# Patient Record
Sex: Female | Born: 1970 | Race: White | Hispanic: No | Marital: Married | Smoking: Former smoker
Health system: Southern US, Community
[De-identification: ages and names within clinical notes are randomized; demographics above are authoritative.]

## PROBLEM LIST (undated history)

## (undated) DIAGNOSIS — N189 Chronic kidney disease, unspecified: Secondary | ICD-10-CM

## (undated) DIAGNOSIS — M2550 Pain in unspecified joint: Secondary | ICD-10-CM

## (undated) DIAGNOSIS — T7840XA Allergy, unspecified, initial encounter: Secondary | ICD-10-CM

## (undated) DIAGNOSIS — Z8249 Family history of ischemic heart disease and other diseases of the circulatory system: Secondary | ICD-10-CM

## (undated) HISTORY — PX: WISDOM TOOTH EXTRACTION: SHX21

## (undated) HISTORY — DX: Chronic kidney disease, unspecified: N18.9

## (undated) HISTORY — DX: Pain in unspecified joint: M25.50

## (undated) HISTORY — DX: Family history of ischemic heart disease and other diseases of the circulatory system: Z82.49

## (undated) HISTORY — PX: HAND SURGERY: SHX662

## (undated) HISTORY — DX: Allergy, unspecified, initial encounter: T78.40XA

## (undated) HISTORY — PX: TUBAL LIGATION: SHX77

---

## 2006-04-23 ENCOUNTER — Ambulatory Visit: Payer: Self-pay | Admitting: Orthopedic Surgery

## 2006-04-24 ENCOUNTER — Ambulatory Visit (HOSPITAL_COMMUNITY): Admission: RE | Admit: 2006-04-24 | Discharge: 2006-04-24 | Payer: Self-pay | Admitting: Orthopedic Surgery

## 2006-04-30 ENCOUNTER — Ambulatory Visit: Payer: Self-pay | Admitting: Orthopedic Surgery

## 2006-06-12 ENCOUNTER — Encounter (INDEPENDENT_AMBULATORY_CARE_PROVIDER_SITE_OTHER): Payer: Self-pay | Admitting: *Deleted

## 2006-06-12 ENCOUNTER — Ambulatory Visit (HOSPITAL_BASED_OUTPATIENT_CLINIC_OR_DEPARTMENT_OTHER): Admission: RE | Admit: 2006-06-12 | Discharge: 2006-06-12 | Payer: Self-pay | Admitting: Orthopedic Surgery

## 2007-07-17 ENCOUNTER — Ambulatory Visit: Payer: Self-pay | Admitting: Family Medicine

## 2007-07-17 DIAGNOSIS — F17200 Nicotine dependence, unspecified, uncomplicated: Secondary | ICD-10-CM

## 2007-07-17 DIAGNOSIS — J309 Allergic rhinitis, unspecified: Secondary | ICD-10-CM | POA: Insufficient documentation

## 2007-07-17 LAB — CONVERTED CEMR LAB
Bilirubin Urine: NEGATIVE
Glucose, Urine, Semiquant: NEGATIVE
Ketones, urine, test strip: NEGATIVE
Pap Smear: NORMAL

## 2007-07-18 LAB — CONVERTED CEMR LAB
Chloride: 108 meq/L (ref 96–112)
Creatinine, Ser: 0.79 mg/dL (ref 0.40–1.20)
Sodium: 143 meq/L (ref 135–145)

## 2007-07-21 ENCOUNTER — Telehealth (INDEPENDENT_AMBULATORY_CARE_PROVIDER_SITE_OTHER): Payer: Self-pay | Admitting: *Deleted

## 2007-07-31 ENCOUNTER — Ambulatory Visit: Payer: Self-pay | Admitting: Family Medicine

## 2007-09-11 ENCOUNTER — Ambulatory Visit: Payer: Self-pay | Admitting: Family Medicine

## 2007-12-11 ENCOUNTER — Encounter (INDEPENDENT_AMBULATORY_CARE_PROVIDER_SITE_OTHER): Payer: Self-pay | Admitting: Family Medicine

## 2007-12-11 ENCOUNTER — Ambulatory Visit: Payer: Self-pay | Admitting: Family Medicine

## 2007-12-11 ENCOUNTER — Other Ambulatory Visit: Admission: RE | Admit: 2007-12-11 | Discharge: 2007-12-11 | Payer: Self-pay | Admitting: Family Medicine

## 2007-12-29 ENCOUNTER — Encounter (INDEPENDENT_AMBULATORY_CARE_PROVIDER_SITE_OTHER): Payer: Self-pay | Admitting: Family Medicine

## 2008-01-04 LAB — CONVERTED CEMR LAB
Basophils Absolute: 0 10*3/uL (ref 0.0–0.1)
Basophils Relative: 0 % (ref 0–1)
Cholesterol: 152 mg/dL (ref 0–200)
Eosinophils Absolute: 0.3 10*3/uL (ref 0.0–0.7)
HCT: 44.5 % (ref 36.0–46.0)
Hemoglobin: 14.5 g/dL (ref 12.0–15.0)
LDL Cholesterol: 96 mg/dL (ref 0–99)
Lymphs Abs: 3 10*3/uL (ref 0.7–4.0)
MCV: 89.9 fL (ref 78.0–100.0)
Monocytes Absolute: 0.8 10*3/uL (ref 0.1–1.0)
Neutrophils Relative %: 55 % (ref 43–77)
Platelets: 312 10*3/uL (ref 150–400)
RBC: 4.95 M/uL (ref 3.87–5.11)
RDW: 12.9 % (ref 11.5–15.5)
Total CHOL/HDL Ratio: 3.9

## 2008-07-29 ENCOUNTER — Ambulatory Visit: Payer: Self-pay | Admitting: Internal Medicine

## 2008-07-29 DIAGNOSIS — J019 Acute sinusitis, unspecified: Secondary | ICD-10-CM

## 2009-07-26 ENCOUNTER — Encounter: Payer: Self-pay | Admitting: Physician Assistant

## 2010-06-10 ENCOUNTER — Encounter: Payer: Self-pay | Admitting: Orthopedic Surgery

## 2010-06-19 NOTE — Letter (Signed)
Summary: M,EDICAL RELEASE  M,EDICAL RELEASE   Imported By: Lind Guest 07/26/2009 14:18:30  _____________________________________________________________________  External Attachment:    Type:   Image     Comment:   External Document

## 2010-10-05 NOTE — Op Note (Signed)
Kylie Castillo, Kylie Castillo                ACCOUNT NO.:  [id number]   MEDICAL RECORD NO.:  [id number]          PATIENT TYPE:  AMB   LOCATION:  DSC                          FACILITY:  [facility]   PHYSICIAN:  Katy Fitch. Sypher, M.D. DATE OF BIRTH:  1970/07/12   DATE OF PROCEDURE:  06/12/2006  DATE OF DISCHARGE:                               OPERATIVE REPORT   PREOP DIAGNOSES:  1. 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  2. Five masses dorsal aspect of right index finger MP joint consistent      with either mucoid skin degeneration or granuloma annulare.   POSTOP DIAGNOSES:  1. 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   OPERATION:  1. Excision of pedunculated osteochondroma and pseudo capsule right      small finger.  This is a wide marginal resection.  2. Excision of 5 granuloma annulare lesions from dorsal aspect of      right index finger metacarpal phalangeal joint.   OPERATING SURGEON:  Katy Fitch. Sypher, M.D.   ASSISTANT:  Marveen Reeks. Dasnoit, P.A.-C.   ANESTHESIA:  General by LMA.   SUPERVISING ANESTHESIOLOGIST:  Quita Skye. Krista Blue, M.D.   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 Fuller Canada of [place], [place] for evaluation of the  pedunculated osteochondroma of the right small finger middle phalanx.  She had a large firm mass presenting on the palmar surface of her middle  phalanx.  She had an MRI that confirmed a probable osteochondroma.  The  exact histopathology cannot be confirmed without biopsy.  In addition  she had five small masses on the dorsal aspect of her right index  finger, MP joint, that appeared to be adherent to the deep  surface of  the dermis.  Her skin was expanded and translucent.  These looked like  they could be mucoid degeneration of the skin lesions.  We recommended  excisional biopsy of her osteochondral lesion and excisional biopsy of  her index finger mucoid masses.  After informed consent, she is brought  to the operating at this time.  Preoperatively she was carefully advised  that we could not offer prognosis until we had a tissue diagnosis of  each lesion.   DESCRIPTION OF PROCEDURE:  Kylie Castillo is brought to the operating room  and placed in the supine position on the operating table.  Following the  induction of general anesthesia by LMA technique.  The right arm was  prepped with Betadine soap and solution and sterilely draped.  Following  exsanguination of the right arm with Esmarch bandage, the arterial  tourniquet on the proximal brachium was inflated to 120 mmHg.  Procedure  commenced with planning of a Brunner zigzag incision from the A2 pulley  to the A5 pulley.  The skin incisions were taken sharply followed by  careful elevation of skin flaps avoiding interruption of the pseudo  capsule surrounding the osteochondral lesion.  The flexor sheath was  identified proximally as were the radial and ulnar neurovascular  bundles.  The bundles were dissected distally.  The radial neurovascular  bundle was not impeded by the lesion.  The ulnar neurovascular bundle  was on the ulnar aspect of lesion.  The flexor tendons were radial to  lesion.   With careful circumferential dissection we identified the pedunculated  base of the mass.  Given my concern about possible injury to the ulnar  neurovascular bundle, I elected to remove the lesion in segments with a  large rongeur.  This was removed in four quadrants; and the pedunculated  base was carefully removed with a fine rongeur down to the normal  contour of the middle phalanx.  The pseudo capsule was meticulously  dissected.  The ulnar  neurovascular bundle was dissected, and found be  intact.  It was in its proper anatomic position posterior to Grayson's  ligaments. The pedunculated site on the middle phalanx was then  electrocauterized with bipolar forceps and saline, creating thermal  injury to the superficial bone cells.  The wound was then irrigated and  repaired with a corner suture of 5-0 nylon and interrupted sutures of 5-  0 nylon.   Attention was then directed to the index finger.  A curvilinear incision  was fashioned to allow skin flap to be elevated to expose the five  mucoid lesions.  The extensor tendon was identified; and a plane  developed between the epitenon and the dermis.  The skin was everted;  and the lesions were noted to be probable granuloma annulare. These were  meticulously removed with a medium-size rongeur down to normal-appearing  dermis.  There all passed off in formalin for pathologic evaluation.   Hemostasis was achieved followed by repair of the dorsal wound with  intradermal 4-0 Prolene.  A compressive dressing was applied with  Xeroflo, sterile gauze, and a compressive ACE wrap dressing.  There were  no apparent complications.  Kylie Castillo tolerated the surgery and  anesthesia well.  She was transferred to recovery with stable vital  signs.   She will be discharged home with a prescription for Percocet 5 mg 1-2  tablets p.o. q.4-6 h. p.r.n. pain 30 tablets without refill.  Also  Keflex 5 mg one p.o. q.8 h. x4 days as prophylactic antibiotic.      Katy Fitch Sypher, M.D.  Electronically Signed     RVS/MEDQ  D:  06/12/2006  T:  06/12/2006  Job:  [id number]   cc:   Vickki Hearing, M.D.  Tammy R. Collins [place], M.D.

## 2012-07-04 ENCOUNTER — Other Ambulatory Visit: Payer: Self-pay

## 2013-03-25 ENCOUNTER — Other Ambulatory Visit: Payer: Self-pay

## 2013-04-27 ENCOUNTER — Encounter: Payer: Self-pay | Admitting: Orthopedic Surgery

## 2013-08-30 ENCOUNTER — Other Ambulatory Visit: Payer: Self-pay | Admitting: Gastroenterology

## 2013-08-30 DIAGNOSIS — Z139 Encounter for screening, unspecified: Secondary | ICD-10-CM

## 2013-09-01 LAB — QUANTIFERON TB GOLD ASSAY (BLOOD)
INTERFERON GAMMA RELEASE ASSAY: NEGATIVE
Mitogen value: 9.56 IU/mL
QUANTIFERON NIL VALUE: 0.02 [IU]/mL
QUANTIFERON TB AG MINUS NIL: 0.01 [IU]/mL
TB AG VALUE: 0.03 [IU]/mL

## 2013-09-08 ENCOUNTER — Other Ambulatory Visit: Payer: Self-pay | Admitting: Gastroenterology

## 2013-09-08 MED ORDER — CIPROFLOXACIN HCL 500 MG PO TABS: 500.0000 mg | ORAL_TABLET | Freq: Two times a day (BID) | ORAL | Status: DC

## 2015-03-10 ENCOUNTER — Telehealth: Payer: 59 | Admitting: Family

## 2015-03-10 DIAGNOSIS — J069 Acute upper respiratory infection, unspecified: Secondary | ICD-10-CM | POA: Diagnosis not present

## 2015-03-10 MED ORDER — FLUTICASONE PROPIONATE 50 MCG/ACT NA SUSP: 1.0000 | Freq: Two times a day (BID) | NASAL | Status: DC

## 2015-03-10 NOTE — Progress Notes (Signed)
We are sorry that you are not feeling well.  Here is how we plan to help!  Based on what you have shared with me it looks like you have sinusitis.  Sinusitis is inflammation and infection in the sinus cavities of the head.  Based on your presentation I believe you most likely have Acute Viral Sinusitis. This is an infection most likely caused by a virus.  There is not specific treatment for viral sinusitis other than to help you with the symptoms until the infection runs it's course.  You may use an oral decongestant such as Mucinex D or if you have glaucoma or high blood pressure use plain Mucinex.  Saline nasal spray help and can safely be used as often as needed for congestion, I have prescribed fluticason nasal spray. Spray two sprays in each nostril twice a day to help reduce your symptoms.  Some authorities believe that zinc sprays or the use of Echinacea may shorten the course of your symptoms.  Sinus infections are not as easily transmitted as other respiratory infection, however we still recommend that you avoid close contact with loved ones, especially the very young and elderly.  Remember to wash your hands thoroughly throughout the day as this is the number one way to prevent the spread of infection!  Home Care:  Only take medications as instructed by your medical team.  Complete the entire course of an antibiotic.  Do not take these medications with alcohol.  A steam or ultrasonic humidifier can help congestion.  You can place a towel over your head and breathe in the steam from hot water coming from a faucet.  Avoid close contacts especially the very young and the elderly.  Cover your mouth when you cough or sneeze.  Always remember to wash your hands.  Get Help Right Away If:  You develop worsening fever or sinus pain.  You develop a severe head ache or visual changes.  Your symptoms persist after you have completed your treatment plan.  Make sure you  Understand these  instructions.  Will watch your condition.  Will get help right away if you are not doing well or get worse.  Your e-visit answers were reviewed by a board certified advanced clinical practitioner to complete your personal care plan.  Depending on the condition, your plan could have included both over the counter or prescription medications.  If there is a problem please reply  once you have received a response from your provider.  Your safety is important to us.  If you have drug allergies check your prescription carefully.    You can use MyChart to ask questions about today's visit, request a non-urgent call back, or ask for a work or school excuse for 24 hours related to this e-Visit. If it has been greater than 24 hours you will need to follow up with your provider, or enter a new e-Visit to address those concerns.  You will get an e-mail in the next two days asking about your experience.  I hope that your e-visit has been valuable and will speed your recovery. Thank you for using e-visits.    

## 2015-03-17 ENCOUNTER — Ambulatory Visit (INDEPENDENT_AMBULATORY_CARE_PROVIDER_SITE_OTHER): Payer: 59 | Admitting: Family Medicine

## 2015-03-17 ENCOUNTER — Telehealth: Payer: 59 | Admitting: Neurology

## 2015-03-17 VITALS — BP 130/78 | HR 82 | Temp 98.2°F | Resp 16 | Ht 65.25 in | Wt 141.0 lb

## 2015-03-17 DIAGNOSIS — J209 Acute bronchitis, unspecified: Secondary | ICD-10-CM

## 2015-03-17 DIAGNOSIS — J309 Allergic rhinitis, unspecified: Secondary | ICD-10-CM

## 2015-03-17 DIAGNOSIS — J208 Acute bronchitis due to other specified organisms: Secondary | ICD-10-CM | POA: Diagnosis not present

## 2015-03-17 DIAGNOSIS — F17200 Nicotine dependence, unspecified, uncomplicated: Secondary | ICD-10-CM | POA: Diagnosis not present

## 2015-03-17 MED ORDER — FLUTICASONE PROPIONATE 50 MCG/ACT NA SUSP: 2.0000 | Freq: Every day | NASAL | Status: DC

## 2015-03-17 MED ORDER — AZITHROMYCIN 500 MG PO TABS: 500.0000 mg | ORAL_TABLET | Freq: Every day | ORAL | Status: DC

## 2015-03-17 MED ORDER — ALBUTEROL SULFATE (2.5 MG/3ML) 0.083% IN NEBU
2.5000 mg | INHALATION_SOLUTION | Freq: Once | RESPIRATORY_TRACT | Status: AC
  Administered 2015-03-17: 2.5 mg via RESPIRATORY_TRACT

## 2015-03-17 MED ORDER — IPRATROPIUM BROMIDE 0.02 % IN SOLN
0.5000 mg | Freq: Once | RESPIRATORY_TRACT | Status: AC
  Administered 2015-03-17: 0.5 mg via RESPIRATORY_TRACT

## 2015-03-17 MED ORDER — BENZONATATE 200 MG PO CAPS: 200.0000 mg | ORAL_CAPSULE | Freq: Two times a day (BID) | ORAL | Status: DC | PRN

## 2015-03-17 MED ORDER — ALBUTEROL SULFATE 108 (90 BASE) MCG/ACT IN AEPB: 2.0000 | INHALATION_SPRAY | RESPIRATORY_TRACT | Status: DC | PRN

## 2015-03-17 MED ORDER — DEXTROMETHORPHAN POLISTIREX ER 30 MG/5ML PO SUER: 30.0000 mg | Freq: Two times a day (BID) | ORAL | Status: DC

## 2015-03-17 MED ORDER — HYDROCODONE-HOMATROPINE 5-1.5 MG/5ML PO SYRP: 5.0000 mL | ORAL_SOLUTION | Freq: Three times a day (TID) | ORAL | Status: DC | PRN

## 2015-03-17 NOTE — Progress Notes (Signed)
Subjective:  This chart was scribed for Delman Cheadle, MD by Thea Alken, ED Scribe. This patient was seen in room 10 and the patient's care was started at 1:32 PM.  Patient ID: Kylie Castillo, female    DOB: 05-Sep-1970, 44 y.o.   MRN: [id number]  HPI   Chief Complaint  Patient presents with  . Cough    x 1 week  . chest congestion    x 1 week  . Sinusitis    runny nose/ x 1 week    HPI Comments: Kylie Castillo is a 44 y.o. female who presents to the [facility] complaining of cough. She was seen on an e-visit today. They informed her that she had a URI, diagnosed with viral bronchitis. They advised her to take Ibuprofen and tylenol and prescribed her tessalon.  Symptoms started 9 days ago with nasal congestion. She stayed home the following day to rest but symptoms worsening this week with cough x 5 days ago, fever highest being 101, nasal congestion, rhinorrhea, and wheezing that started yesterday. She has tried tessalon in the past which did not work for her and did not bother picking up medication prescribed at [facility]. She has been Claritin and Robitussin. She has hx of pneumonia. She denies feeling CP,SOB, light headedness. Pt is a smoker.    Past Medical History  Diagnosis Date  . Allergy    Allergies  Allergen Reactions  . Iodine     REACTION: Swelling if on skin In system severe bradycardia - states when she had twins, had interanal exam vaginally with this and became hypotensive with severe swelling.  . Varenicline Tartrate     REACTION: Mean and angry   Prior to Admission medications   Medication Sig Start Date End Date Taking? Authorizing Provider  benzonatate (TESSALON) 200 MG capsule Take 1 capsule (200 mg total) by mouth 2 (two) times daily as needed for cough. 03/17/15  Yes Gardiner Barefoot, NP  ciprofloxacin (CIPRO) 500 MG tablet Take 1 tablet (500 mg total) by mouth 2 (two) times daily. Patient not taking: Reported on 03/17/2015 09/08/13   Orvil Feil, NP  fluticasone [facility]) 50 MCG/ACT nasal spray Place 1-2 sprays into both nostrils 2 (two) times daily. Patient not taking: Reported on 03/17/2015 03/10/15   Benjamine Mola, FNP  fluticasone (FLONASE) 50 MCG/ACT nasal spray Place 2 sprays into both nostrils daily. Patient not taking: Reported on 03/17/2015 03/17/15   Gardiner Barefoot, NP   Review of Systems  Constitutional: Positive for fever.  HENT: Positive for congestion and postnasal drip. Negative for rhinorrhea.   Respiratory: Positive for cough and wheezing. Negative for shortness of breath.   Cardiovascular: Negative for chest pain.  Neurological: Negative for light-headedness.       Objective:   Physical Exam  Constitutional: She is oriented to person, place, and time. She appears well-developed and well-nourished. No distress.  HENT:  Head: Normocephalic and atraumatic.  Right Ear: A middle ear effusion is present.  Left Ear: A middle ear effusion is present.  Mouth/Throat: Posterior oropharyngeal erythema present.  Eyes: Conjunctivae and EOM are normal.  Neck: Neck supple.  Cardiovascular: Normal rate, regular rhythm and normal heart sounds.  Exam reveals no gallop and no friction rub.   No murmur heard. Pulmonary/Chest: Effort normal. She has wheezes ( inspiratory) in the right upper field, the right middle field, the right lower field, the left upper field and the left lower field.  She has rhonchi ( expiratory) in the right upper field, the right middle field, the right lower field, the left upper field, the left middle field and the left lower field. She has no rales.  Inspiratory wheeze, expiratory rhonchi throughout all fields but worse in bases.   Musculoskeletal: Normal range of motion.  Neurological: She is alert and oriented to person, place, and time.  Skin: Skin is warm and dry.  Psychiatric: She has a normal mood and affect. Her behavior is normal.  Nursing note and vitals reviewed.  Filed  Vitals:   03/17/15 1323  BP: 130/78  Pulse: 82  Temp: 98.2 F (36.8 C)  TempSrc: Oral  Resp: 16  Height: 5' 5.25" (1.657 m)  Weight: 141 lb (63.957 kg)  SpO2: 98%       Assessment & Plan:    1. Acute bronchitis, unspecified organism   2. TOBACCO USER     Meds ordered this encounter  Medications  . albuterol (PROVENTIL) (2.5 MG/3ML) 0.083% nebulizer solution 2.5 mg    Sig:   . ipratropium (ATROVENT) nebulizer solution 0.5 mg    Sig:   . azithromycin (ZITHROMAX) 500 MG tablet    Sig: Take 1 tablet (500 mg total) by mouth daily.    Dispense:  3 tablet    Refill:  0  . Albuterol Sulfate (PROAIR RESPICLICK) 191 (90 BASE) MCG/ACT AEPB    Sig: Inhale 2 puffs into the lungs every 4 (four) hours as needed.    Dispense:  1 each    Refill:  2  . HYDROcodone-homatropine (HYCODAN) 5-1.5 MG/5ML syrup    Sig: Take 5 mLs by mouth every 8 (eight) hours as needed for cough.    Dispense:  120 mL    Refill:  0  . dextromethorphan (DELSYM) 30 MG/5ML liquid    Sig: Take 5 mLs (30 mg total) by mouth 2 (two) times daily.    Dispense:  148 mL    Refill:  0   I personally performed the services described in this documentation, which was scribed in my presence. The recorded information has been reviewed and considered, and addended by me as needed.  Delman Cheadle, MD MPH

## 2015-03-17 NOTE — Progress Notes (Deleted)
We are sorry that you are not feeling well.  Here is how we plan to help!  Based on what you have shared with me it looks like you have upper respiratory tract inflammation that has resulted in a significant cough.  Inflammation and infection in the upper respiratory tract is commonly called bronchitis and has four common causes:  Allergies, Viral Infections, Acid Reflux and Bacterial Infections.  Allergies, viruses and acid reflux are treated by controlling symptoms or eliminating the cause. An example might be a cough caused by taking certain blood pressure medications. You stop the cough by changing the medication. Another example might be a cough caused by acid reflux. Controlling the reflux helps control the cough.  Based on your presentation I believe you most likely have A cough due to a virus.  This is called viral bronchitis and is best treated by rest, plenty of fluids and control of the cough.  You may use Ibuprofen or Tylenol as directed to help your symptoms. Cough could be from allergies as well.    In addition you may use A prescription cough medication called Tessalon Perles 100mg . You may take 1-2 capsules every 8 hours as needed for your cough. A prescription nasal spray called Flonase. You can also buy Flonase OTC but your prescription will likely be less expensive. Spray 1-2 sprays in each nostril twice a day for 7 days.     HOME CARE . Only take medications as instructed by your medical team. . Complete the entire course of an antibiotic. . Drink plenty of fluids and get plenty of rest. . Avoid close contacts especially the very young and the elderly . Cover your mouth if you cough or cough into your sleeve. . Always remember to wash your hands . A steam or ultrasonic humidifier can help congestion.    GET HELP RIGHT AWAY IF: . You develop worsening fever. . You become short of breath . You cough up blood. . Your symptoms persist after you have completed your treatment  plan MAKE SURE YOU   Understand these instructions.  Will watch your condition.  Will get help right away if you are not doing well or get worse.  Your e-visit answers were reviewed by a board certified advanced clinical practitioner to complete your personal care plan.  Depending on the condition, your plan could have included both over the counter or prescription medications. If there is a problem please reply  once you have received a response from your provider. Your safety is important to [place].  If you have drug allergies check your prescription carefully.    You can use MyChart to ask questions about today's visit, request a non-urgent call back, or ask for a work or school excuse for 24 hours related to this e-Visit. If it has been greater than 24 hours you will need to follow up with your provider, or enter a new e-Visit to address those concerns. You will get an e-mail in the next two days asking about your experience.  I hope that your e-visit has been valuable and will speed your recovery. Thank you for using e-visits.

## 2015-03-17 NOTE — Progress Notes (Deleted)
We are sorry that you are not feeling well.  Here is how we plan to help!  Based on what you have shared with me it looks like you have upper respiratory tract inflammation that has resulted in a significant cough.  Inflammation and infection in the upper respiratory tract is commonly called bronchitis and has four common causes:  Allergies, Viral Infections, Acid Reflux and Bacterial Infections.  Allergies, viruses and acid reflux are treated by controlling symptoms or eliminating the cause. An example might be a cough caused by taking certain blood pressure medications. You stop the cough by changing the medication. Another example might be a cough caused by acid reflux. Controlling the reflux helps control the cough.  Based on your presentation I believe you most likely have Viral bronchitis with allergies.    In addition you may use A prescription cough medication called Tessalon Perles 100mg . You may take 1-2 capsules every 8 hours as needed for your cough.    HOME CARE . Only take medications as instructed by your medical team. . Complete the entire course of an antibiotic. . Drink plenty of fluids and get plenty of rest. . Avoid close contacts especially the very young and the elderly . Cover your mouth if you cough or cough into your sleeve. . Always remember to wash your hands . A steam or ultrasonic humidifier can help congestion.    GET HELP RIGHT AWAY IF: . You develop worsening fever. . You become short of breath . You cough up blood. . Your symptoms persist after you have completed your treatment plan MAKE SURE YOU   Understand these instructions.  Will watch your condition.  Will get help right away if you are not doing well or get worse.  Your e-visit answers were reviewed by a board certified advanced clinical practitioner to complete your personal care plan.  Depending on the condition, your plan could have included both over the counter or prescription  medications. If there is a problem please reply  once you have received a response from your provider. Your safety is important to [place].  If you have drug allergies check your prescription carefully.    You can use MyChart to ask questions about today's visit, request a non-urgent call back, or ask for a work or school excuse for 24 hours related to this e-Visit. If it has been greater than 24 hours you will need to follow up with your provider, or enter a new e-Visit to address those concerns. You will get an e-mail in the next two days asking about your experience.  I hope that your e-visit has been valuable and will speed your recovery. Thank you for using e-visits.

## 2015-03-17 NOTE — Progress Notes (Signed)
We are sorry that you are not feeling well.  Here is how we plan to help!  Based on what you have shared with me it looks like you have upper respiratory tract inflammation that has resulted in a significant cough.  Inflammation and infection in the upper respiratory tract is commonly called bronchitis and has four common causes:  Allergies, Viral Infections, Acid Reflux and Bacterial Infections.  Allergies, viruses and acid reflux are treated by controlling symptoms or eliminating the cause. An example might be a cough caused by taking certain blood pressure medications. You stop the cough by changing the medication. Another example might be a cough caused by acid reflux. Controlling the reflux helps control the cough.  Based on your presentation I believe you most likely have A cough due to a virus.  This is called viral bronchitis and is best treated by rest, plenty of fluids and control of the cough.  You may use Ibuprofen or Tylenol as directed to help your symptoms.    In addition you may use A prescription cough medication called Tessalon Perles 100mg. You may take 1-2 capsules every 8 hours as needed for your cough.    HOME CARE . Only take medications as instructed by your medical team. . Complete the entire course of an antibiotic. . Drink plenty of fluids and get plenty of rest. . Avoid close contacts especially the very young and the elderly . Cover your mouth if you cough or cough into your sleeve. . Always remember to wash your hands . A steam or ultrasonic humidifier can help congestion.    GET HELP RIGHT AWAY IF: . You develop worsening fever. . You become short of breath . You cough up blood. . Your symptoms persist after you have completed your treatment plan MAKE SURE YOU   Understand these instructions.  Will watch your condition.  Will get help right away if you are not doing well or get worse.  Your e-visit answers were reviewed by a board certified advanced  clinical practitioner to complete your personal care plan.  Depending on the condition, your plan could have included both over the counter or prescription medications. If there is a problem please reply  once you have received a response from your provider. Your safety is important to us.  If you have drug allergies check your prescription carefully.    You can use MyChart to ask questions about today's visit, request a non-urgent call back, or ask for a work or school excuse for 24 hours related to this e-Visit. If it has been greater than 24 hours you will need to follow up with your provider, or enter a new e-Visit to address those concerns. You will get an e-mail in the next two days asking about your experience.  I hope that your e-visit has been valuable and will speed your recovery. Thank you for using e-visits.   

## 2015-03-17 NOTE — Patient Instructions (Signed)

## 2015-06-19 DIAGNOSIS — H5213 Myopia, bilateral: Secondary | ICD-10-CM | POA: Diagnosis not present

## 2015-07-25 ENCOUNTER — Telehealth: Payer: 59 | Admitting: Family

## 2015-07-25 DIAGNOSIS — N390 Urinary tract infection, site not specified: Secondary | ICD-10-CM | POA: Diagnosis not present

## 2015-07-25 DIAGNOSIS — A499 Bacterial infection, unspecified: Secondary | ICD-10-CM

## 2015-07-25 MED ORDER — NITROFURANTOIN MONOHYD MACRO 100 MG PO CAPS: 100.0000 mg | ORAL_CAPSULE | Freq: Two times a day (BID) | ORAL | Status: DC

## 2015-07-25 NOTE — Progress Notes (Signed)

## 2015-09-30 ENCOUNTER — Telehealth: Payer: 59 | Admitting: Family

## 2015-09-30 DIAGNOSIS — M545 Low back pain, unspecified: Secondary | ICD-10-CM

## 2015-09-30 MED ORDER — CYCLOBENZAPRINE HCL 10 MG PO TABS: 10.0000 mg | ORAL_TABLET | Freq: Three times a day (TID) | ORAL | Status: DC | PRN

## 2015-09-30 MED ORDER — ETODOLAC 300 MG PO CAPS: 300.0000 mg | ORAL_CAPSULE | Freq: Two times a day (BID) | ORAL | Status: DC

## 2015-09-30 NOTE — Progress Notes (Signed)

## 2016-02-19 DIAGNOSIS — M2550 Pain in unspecified joint: Secondary | ICD-10-CM | POA: Diagnosis not present

## 2016-04-02 DIAGNOSIS — M2550 Pain in unspecified joint: Secondary | ICD-10-CM | POA: Diagnosis not present

## 2016-04-02 DIAGNOSIS — R5382 Chronic fatigue, unspecified: Secondary | ICD-10-CM | POA: Diagnosis not present

## 2016-04-02 DIAGNOSIS — R768 Other specified abnormal immunological findings in serum: Secondary | ICD-10-CM | POA: Diagnosis not present

## 2016-05-02 DIAGNOSIS — M2550 Pain in unspecified joint: Secondary | ICD-10-CM | POA: Diagnosis not present

## 2016-05-02 DIAGNOSIS — Z6823 Body mass index (BMI) 23.0-23.9, adult: Secondary | ICD-10-CM | POA: Diagnosis not present

## 2016-05-02 DIAGNOSIS — R5382 Chronic fatigue, unspecified: Secondary | ICD-10-CM | POA: Diagnosis not present

## 2016-05-02 DIAGNOSIS — R768 Other specified abnormal immunological findings in serum: Secondary | ICD-10-CM | POA: Diagnosis not present

## 2016-06-18 DIAGNOSIS — H5213 Myopia, bilateral: Secondary | ICD-10-CM | POA: Diagnosis not present

## 2017-07-05 DIAGNOSIS — H5213 Myopia, bilateral: Secondary | ICD-10-CM | POA: Diagnosis not present

## 2017-07-29 ENCOUNTER — Telehealth: Payer: 59 | Admitting: Family

## 2017-07-29 DIAGNOSIS — B9689 Other specified bacterial agents as the cause of diseases classified elsewhere: Secondary | ICD-10-CM

## 2017-07-29 DIAGNOSIS — J019 Acute sinusitis, unspecified: Secondary | ICD-10-CM | POA: Diagnosis not present

## 2017-07-29 MED ORDER — AMOXICILLIN-POT CLAVULANATE 875-125 MG PO TABS: 1.0000 | ORAL_TABLET | Freq: Two times a day (BID) | ORAL | 0 refills | Status: DC

## 2017-07-29 NOTE — Progress Notes (Signed)

## 2017-12-08 ENCOUNTER — Telehealth: Payer: 59 | Admitting: Family

## 2017-12-08 DIAGNOSIS — M545 Low back pain, unspecified: Secondary | ICD-10-CM

## 2017-12-08 MED ORDER — BACLOFEN 10 MG PO TABS: 10.0000 mg | ORAL_TABLET | Freq: Three times a day (TID) | ORAL | 0 refills | Status: DC | PRN

## 2017-12-08 MED ORDER — ETODOLAC 300 MG PO CAPS: 300.0000 mg | ORAL_CAPSULE | Freq: Two times a day (BID) | ORAL | 0 refills | Status: DC

## 2017-12-08 NOTE — Progress Notes (Signed)

## 2018-04-14 ENCOUNTER — Telehealth: Payer: 59 | Admitting: Family Medicine

## 2018-04-14 DIAGNOSIS — N390 Urinary tract infection, site not specified: Secondary | ICD-10-CM | POA: Diagnosis not present

## 2018-04-14 MED ORDER — CEPHALEXIN 500 MG PO CAPS: 500.0000 mg | ORAL_CAPSULE | Freq: Two times a day (BID) | ORAL | 0 refills | Status: AC

## 2018-04-14 NOTE — Progress Notes (Signed)

## 2018-08-31 ENCOUNTER — Telehealth: Payer: 59 | Admitting: Physician Assistant

## 2018-08-31 DIAGNOSIS — R319 Hematuria, unspecified: Secondary | ICD-10-CM | POA: Diagnosis not present

## 2018-08-31 DIAGNOSIS — N390 Urinary tract infection, site not specified: Secondary | ICD-10-CM | POA: Diagnosis not present

## 2018-08-31 MED ORDER — NITROFURANTOIN MONOHYD MACRO 100 MG PO CAPS: 100.0000 mg | ORAL_CAPSULE | Freq: Two times a day (BID) | ORAL | 0 refills | Status: DC

## 2018-08-31 NOTE — Progress Notes (Signed)

## 2018-12-28 ENCOUNTER — Ambulatory Visit (INDEPENDENT_AMBULATORY_CARE_PROVIDER_SITE_OTHER): Payer: 59 | Admitting: Family Medicine

## 2018-12-28 ENCOUNTER — Other Ambulatory Visit: Payer: Self-pay

## 2018-12-28 ENCOUNTER — Encounter: Payer: Self-pay | Admitting: Family Medicine

## 2018-12-28 VITALS — BP 143/82 | HR 76 | Temp 98.1°F | Ht 65.0 in | Wt 147.8 lb

## 2018-12-28 DIAGNOSIS — L089 Local infection of the skin and subcutaneous tissue, unspecified: Secondary | ICD-10-CM | POA: Diagnosis not present

## 2018-12-28 DIAGNOSIS — Z0000 Encounter for general adult medical examination without abnormal findings: Secondary | ICD-10-CM

## 2018-12-28 MED ORDER — MUPIROCIN 2 % EX OINT: 1.0000 "application " | TOPICAL_OINTMENT | Freq: Two times a day (BID) | CUTANEOUS | 0 refills | Status: DC

## 2018-12-28 NOTE — Progress Notes (Signed)
New Patient Office Visit  Subjective:  Patient ID: Kylie Castillo, female    DOB: January 14, 1971  Age: 48 y.o. MRN: [id number]  CC:  Chief Complaint  Patient presents with  . New Patient (Initial Visit)  toe infection  HPI Kylie Castillo presents for Patient is complaining of Big Toe toenail pain for 2 months. Otherwise health is good   Past Medical History:  Diagnosis Date  . Allergy     Past Surgical History:  Procedure Laterality Date  . HAND SURGERY Right   . 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 Marital status: Married    Spouse name: Not on file  . Number of children: Not on file  . Years of education: Not on file  . Highest education level: Not on file  Occupational History  . Not on file  Social Needs  . Financial resource strain: Not on file  . Food insecurity    Worry: Not on file    Inability: Not on file  . Transportation needs    Medical: Not on file    Non-medical: Not on file  Tobacco Use  . Smoking status: Quit smoking 2 years ago  Substance and Sexual Activity  . Alcohol use: Not on file  . Drug use: Not on file  . Sexual activity: Not on file  Lifestyle  . Physical activity    Days per week: Not on file    Minutes per session: Not on file  . Stress: Not on file  Relationships  . Social Herbalist on phone: Not on file    Gets together: Not on file    Attends religious service: Not on file    Active member of club or organization: Not on file    Attends meetings of clubs or organizations: Not on file    Relationship status: Not on file   . Intimate partner violence    Fear of current or ex partner: Not on file    Emotionally abused: Not on file    Physically abused: Not on file    Forced sexual activity: Not on file  Other Topics Concern  . Not on file  Social History Narrative  . Not on file    ROS Review of Systems  Cardiovascular: Positive for palpitations.  Genitourinary: Negative for difficulty urinating, dysuria and frequency.  Skin: Positive for wound.  Allergic/Immunologic: Positive for environmental allergies.  menopause-1 year post menopausal Pap smear-2015 normal Contacts/glasses [facility]-cystoscopy/u/s-renal-normal Objective:   Today's Vitals: BP (!) 143/82 (BP Location: Right Arm, Patient Position: Sitting, Cuff Size: Normal)   Pulse 76   Temp 98.1 F (36.7 C) (Oral)   Ht 5\' 5"  (1.651 m)   Wt 147 lb 12.8 oz (67 kg)   SpO2 98%   BMI 24.60 kg/m   Physical Exam Constitutional:      Appearance: Normal appearance.  HENT:     Head: Normocephalic.  Neck:     Musculoskeletal: Normal range of motion and neck supple.  Cardiovascular:     Rate and Rhythm: Normal rate and regular rhythm.     Pulses: Normal pulses.     Heart sounds: Normal heart sounds.  Neurological:     Mental Status: She is alert.   eryth- left primary toe-drainage noted-associated tenderness to palpation  Assessment & Plan:  1. Well adult exam - Lipid panel - COMPLETE METABOLIC PANEL WITH GFR - MM Digital Screening Unilat L; Future  2. Toe infection - Wound culture Topical bactroban-rx, soaks-call if not resolved Outpatient Encounter Medications as of 12/28/2018  Medication Sig  . Albuterol Sulfate (PROAIR RESPICLICK) 503 (90 BASE) MCG/ACT AEPB Inhale 2 puffs into the lungs every 4 (four) hours as needed. (Patient not taking: Reported on 12/28/2018)  . amoxicillin-clavulanate (AUGMENTIN) 875-125 MG tablet Take 1 tablet by mouth 2 (two) times daily. (Patient not taking: Reported on 12/28/2018)  . azithromycin  (ZITHROMAX) 500 MG tablet Take 1 tablet (500 mg total) by mouth daily. (Patient not taking: Reported on 12/28/2018)  . baclofen (LIORESAL) 10 MG tablet Take 1 tablet (10 mg total) by mouth every 8 (eight) hours as needed for muscle spasms. (Patient not taking: Reported on 12/28/2018)  . benzonatate (TESSALON) 200 MG capsule Take 1 capsule (200 mg total) by mouth 2 (two) times daily as needed for cough. (Patient not taking: Reported on 12/28/2018)  . cyclobenzaprine (FLEXERIL) 10 MG tablet Take 1 tablet (10 mg total) by mouth 3 (three) times daily as needed for muscle spasms. (Patient not taking: Reported on 12/28/2018)  . dextromethorphan (DELSYM) 30 MG/5ML liquid Take 5 mLs (30 mg total) by mouth 2 (two) times daily. (Patient not taking: Reported on 12/28/2018)  . etodolac (LODINE) 300 MG capsule Take 1 capsule (300 mg total) by mouth 2 (two) times daily. (Patient not taking: Reported on 12/28/2018)  . fluticasone (FLONASE) 50 MCG/ACT nasal spray Place 1-2 sprays into both nostrils 2 (two) times daily. (Patient not taking: Reported on 03/17/2015)  . HYDROcodone-homatropine (HYCODAN) 5-1.5 MG/5ML syrup Take 5 mLs by mouth every 8 (eight) hours as needed for cough. (Patient not taking: Reported on 12/28/2018)  . nitrofurantoin, macrocrystal-monohydrate, (MACROBID) 100 MG capsule Take 1 capsule (100 mg total) by mouth 2 (two) times daily. (Patient not taking: Reported on 12/28/2018)  . nitrofurantoin, macrocrystal-monohydrate, (MACROBID) 100 MG capsule Take 1 capsule (100 mg total) by mouth 2 (two) times daily. (Patient not taking: Reported on 12/28/2018)   No facility-administered encounter medications on file as of 12/28/2018.    LISA Hannah Beat, MD

## 2018-12-28 NOTE — Patient Instructions (Addendum)
Start soaks twice a day with antibiotic applied to area

## 2019-01-02 LAB — WOUND CULTURE
MICRO NUMBER:: 758798
SPECIMEN QUALITY:: ADEQUATE

## 2019-01-16 ENCOUNTER — Other Ambulatory Visit: Payer: Self-pay | Admitting: Family Medicine

## 2019-01-16 MED ORDER — SULFAMETHOXAZOLE-TRIMETHOPRIM 400-80 MG PO TABS: 1.0000 | ORAL_TABLET | Freq: Two times a day (BID) | ORAL | 0 refills | Status: DC

## 2019-01-16 NOTE — Progress Notes (Signed)
+   wound culture-sensitive to Bactrim D/w medication recommendation due to + culture. Pt states wound doing better but not completely resolved.

## 2019-01-19 ENCOUNTER — Ambulatory Visit (INDEPENDENT_AMBULATORY_CARE_PROVIDER_SITE_OTHER): Payer: 59 | Admitting: Family Medicine

## 2019-01-19 ENCOUNTER — Other Ambulatory Visit (HOSPITAL_COMMUNITY)
Admission: RE | Admit: 2019-01-19 | Discharge: 2019-01-19 | Disposition: A | Discharge status: Home / Self Care | Payer: 59 | Source: Ambulatory Visit | Attending: Family Medicine | Admitting: Family Medicine

## 2019-01-19 ENCOUNTER — Telehealth: Payer: Self-pay

## 2019-01-19 ENCOUNTER — Other Ambulatory Visit: Payer: Self-pay

## 2019-01-19 VITALS — BP 116/77 | HR 78 | Temp 98.3°F | Ht 65.0 in | Wt 146.0 lb

## 2019-01-19 DIAGNOSIS — Z0000 Encounter for general adult medical examination without abnormal findings: Secondary | ICD-10-CM

## 2019-01-19 DIAGNOSIS — Z01419 Encounter for gynecological examination (general) (routine) without abnormal findings: Secondary | ICD-10-CM | POA: Insufficient documentation

## 2019-01-19 NOTE — Progress Notes (Signed)
9/1/20208:36 AM  Kylie Castillo Jun 25, 1970, 48 y.o., female [id number]  Chief Complaint  Patient presents with  . Annual Exam    HPI: WELL WOMAN EXAM-pap smear  LMP: 1 year ago CHANGES IN PERIODS: none in 1 year NUMBER OF PREGNANCIES: G3P4-twin vaginal delivery-1 breech CONTRACEPTION/HRT none-tubal LAST MAMMOGRAM/Ultrasound: scheduled GYN SURGERIES: tubal ligation  2015 normal pap No h/o abnormal +FH ovarian CA Fall Risk  12/28/2018  Falls in the past year? 0  Number falls in past yr: 0  Injury with Fall? 0   Depression screen [facility] 2/9 12/28/2018 03/17/2015  Decreased Interest 0 0  Down, Depressed, Hopeless 0 0  PHQ - 2 Score 0 0    Allergies  Allergen Reactions  . Iodine     REACTION: Swelling if on skin In system severe bradycardia - states when she had twins, had interanal exam vaginally with this and became hypotensive with severe swelling.  . Varenicline Tartrate     REACTION: Mean and angry    Past Medical History:  Diagnosis Date  . Allergy     Past Surgical History:  Procedure Laterality Date  . HAND SURGERY Right   . TUBAL LIGATION      Social History   Tobacco Use  . Smoking status: Former Smoker    Packs/day: 1.00    Types: Cigarettes    Quit date: 12/27/2016    Years since quitting: 2.0  Substance Use Topics  . Alcohol use: Not on file    Social History   Social History Narrative  . Not on file     FH-dad-DM, MI-70yo, mother-CVA-82yo-residual weakness , left side, hysterectomy 43 due to fibroids sibs-brothers-died contruction accident 25yo, polyps on colonoscopy-48yo, CVA-48yo, tob abuse Grandmother with colon cancer, onset late 35's died-60yo. Maternal aunt +ovarian CA, Maternal great aunt x 2 ovarian CA  ROS: Constitutional: no loss of appetite,  unexplained weight loss/gain , night sweats, scalp tenderness, prior diagnosis of cancer HEENT: no difficulty with hearing, sinus problems, runny nose, post nasal drip, ringing in the  ears, mouth sores, loose teeth, ear pain, nosebleeds, sore throat, facial pain or numbness, no visual changes, no itching or drainage of the eyes Breast: no nipple discharge or breast lumps palpable CV: palpitations noted 1x/week RESP: no short of breath, night sweats, prolonged cough, wheezing, sputum production,   GI: no heartburn, no changes in bowels GU: no painful urination, frequent of urination, urgency, bladder problems MS: no joint pain, swelling in the joints Skin:toe improved with antibiotics Neuro: no headaches PSY: no insomnia, depression, anxiety Endo: no intolerance to heat or cold, no increase in thirst Heme: no easy brusing, anemia, swollen lymph nodes Allergy:  seasonal allergies  Today's Vitals   01/19/19 0826  BP: 116/77  Pulse: 78  Temp: 98.3 F (36.8 C)  TempSrc: Oral  SpO2: 98%  Weight: 146 lb (66.2 kg)  Height: 5\' 5"  (1.651 m)   Body mass index is 24.3 kg/m.  EXAM:  Constitutional:  HEENT: normocephalic, atraumatic, scalp with no lesions, no hair loss Neck: normal flexion/extension/rotation Thyroid: no enlargement Breast: symmetrical, no nipple discharge, no masses, no skin changes Lungs: clear breath sounds to auscultation Heart: no lifts or heaves , normal S1/2 with no murmur on auscultation Abdomen: no scars or striae, normal bowel sounds, no tenderness to palpation, liver and spleen non palpable Back: no CVA tenderness, no paraspinal tenderness Neuro: awake and alert, oriented to person, place and time, normal gait and balance GYN: normal labia, clitoris, urethral orifice  and introitus,  Cervix-pink with no lesions or discharge noted, pap collected Vagina-no lesions, normal mucosa,  no bulge with straining Bimanual exam: uterus is anterior, tilted left, not enlarged, adnexa non palpable Anus no fissure or hemorrhoids  ASSESSMENT/PLAN:  1. 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 Last  pap 2015 normal - Cytology - PAP Encouraged baseline labwork    Benny Lennert, MD

## 2019-01-19 NOTE — Telephone Encounter (Signed)
LeighAnn Wylodean Shimmel, CMA  

## 2019-01-21 LAB — CYTOLOGY - PAP
Adequacy: ABSENT
Diagnosis: NEGATIVE
HPV: NOT DETECTED

## 2019-02-11 DIAGNOSIS — H5213 Myopia, bilateral: Secondary | ICD-10-CM | POA: Diagnosis not present

## 2019-04-08 ENCOUNTER — Telehealth: Payer: 59 | Admitting: Physician Assistant

## 2019-04-08 DIAGNOSIS — N390 Urinary tract infection, site not specified: Secondary | ICD-10-CM

## 2019-04-08 MED ORDER — CEPHALEXIN 500 MG PO CAPS: 500.0000 mg | ORAL_CAPSULE | Freq: Two times a day (BID) | ORAL | 0 refills | Status: DC

## 2019-04-08 NOTE — Progress Notes (Signed)
We are sorry that you are not feeling well.  Here is how we plan to help!  Based on what you shared with me it looks like you most likely have a simple urinary tract infection.  A UTI (Urinary Tract Infection) is a bacterial infection of the bladder.  Most cases of urinary tract infections are simple to treat but a key part of your care is to encourage you to drink plenty of fluids and watch your symptoms carefully.  I have prescribed Keflex 500 mg twice daily for 5 days.  Your symptoms should gradually improve. Call us if the burning in your urine worsens, you develop worsening fever, back pain or pelvic pain or if your symptoms do not resolve after completing the antibiotic.  Urinary tract infections can be prevented by drinking plenty of water to keep your body hydrated.  Also be sure when you wipe, wipe from front to back and don't hold it in!  If possible, empty your bladder every 4 hours.  Your e-visit answers were reviewed by a board certified advanced clinical practitioner to complete your personal care plan.  Depending on the condition, your plan could have included both over the counter or prescription medications.  If there is a problem please reply  once you have received a response from your provider.  Your safety is important to [place].  If you have drug allergies check your prescription carefully.    You can use MyChart to ask questions about today's visit, request a non-urgent call back, or ask for a work or school excuse for 24 hours related to this e-Visit. If it has been greater than 24 hours you will need to follow up with your provider, or enter a new e-Visit to address those concerns.   You will get an e-mail in the next two days asking about your experience.  I hope that your e-visit has been valuable and will speed your recovery. Thank you for using e-visits.   Greater than 5 minutes, yet less than 10 minutes of time have been spent researching, coordinating, and  implementing care for this patient today

## 2019-04-28 ENCOUNTER — Other Ambulatory Visit: Payer: Self-pay | Admitting: Family Medicine

## 2019-04-28 DIAGNOSIS — Z0000 Encounter for general adult medical examination without abnormal findings: Secondary | ICD-10-CM | POA: Diagnosis not present

## 2019-04-29 LAB — COMPLETE METABOLIC PANEL WITH GFR
AG Ratio: 1.9 (calc) (ref 1.0–2.5)
ALT: 10 U/L (ref 6–29)
AST: 15 U/L (ref 10–35)
Albumin: 4.3 g/dL (ref 3.6–5.1)
Alkaline phosphatase (APISO): 59 U/L (ref 31–125)
BUN: 8 mg/dL (ref 7–25)
CO2: 28 mmol/L (ref 20–32)
Calcium: 9.7 mg/dL (ref 8.6–10.2)
Chloride: 103 mmol/L (ref 98–110)
Creat: 0.99 mg/dL (ref 0.50–1.10)
GFR, Est African American: 79 mL/min/{1.73_m2} (ref >=60)
GFR, Est Non African American: 68 mL/min/{1.73_m2} (ref >=60)
Globulin: 2.3 g/dL (calc) (ref 1.9–3.7)
Glucose, Bld: 83 mg/dL (ref 65–99)
Potassium: 4.3 mmol/L (ref 3.5–5.3)
Sodium: 139 mmol/L (ref 135–146)
Total Bilirubin: 0.6 mg/dL (ref 0.2–1.2)
Total Protein: 6.6 g/dL (ref 6.1–8.1)

## 2019-04-29 LAB — LIPID PANEL
Cholesterol: 186 mg/dL (ref <200)
HDL: 46 mg/dL — ABNORMAL LOW (ref >=50)
LDL Cholesterol (Calc): 119 mg/dL (calc) — ABNORMAL HIGH
Non-HDL Cholesterol (Calc): 140 mg/dL (calc) — ABNORMAL HIGH (ref <130)
Total CHOL/HDL Ratio: 4 (calc) (ref <5.0)
Triglycerides: 105 mg/dL (ref <150)

## 2019-05-11 ENCOUNTER — Other Ambulatory Visit (HOSPITAL_COMMUNITY): Payer: Self-pay | Admitting: Family Medicine

## 2019-05-11 DIAGNOSIS — Z1231 Encounter for screening mammogram for malignant neoplasm of breast: Secondary | ICD-10-CM

## 2019-05-12 ENCOUNTER — Other Ambulatory Visit: Payer: Self-pay

## 2019-05-12 ENCOUNTER — Ambulatory Visit (HOSPITAL_COMMUNITY)
Admission: RE | Admit: 2019-05-12 | Discharge: 2019-05-12 | Disposition: A | Discharge status: Home / Self Care | Payer: 59 | Source: Ambulatory Visit | Attending: Family Medicine | Admitting: Family Medicine

## 2019-05-12 DIAGNOSIS — Z1231 Encounter for screening mammogram for malignant neoplasm of breast: Secondary | ICD-10-CM | POA: Insufficient documentation

## 2019-08-02 ENCOUNTER — Ambulatory Visit: Payer: 59 | Admitting: Family Medicine

## 2019-08-02 ENCOUNTER — Ambulatory Visit (HOSPITAL_COMMUNITY)
Admission: RE | Admit: 2019-08-02 | Discharge: 2019-08-02 | Disposition: A | Discharge status: Home / Self Care | Payer: 59 | Source: Ambulatory Visit | Attending: Family Medicine | Admitting: Family Medicine

## 2019-08-02 ENCOUNTER — Encounter: Payer: Self-pay | Admitting: Family Medicine

## 2019-08-02 ENCOUNTER — Other Ambulatory Visit: Payer: Self-pay

## 2019-08-02 VITALS — BP 131/80 | HR 80 | Temp 98.0°F | Ht 65.0 in | Wt 155.4 lb

## 2019-08-02 DIAGNOSIS — S59912A Unspecified injury of left forearm, initial encounter: Secondary | ICD-10-CM | POA: Diagnosis not present

## 2019-08-02 DIAGNOSIS — M79602 Pain in left arm: Secondary | ICD-10-CM | POA: Diagnosis not present

## 2019-08-02 NOTE — Patient Instructions (Signed)
Xray left forearm

## 2019-08-02 NOTE — Progress Notes (Signed)
Established Patient Office Visit  Subjective:  Patient ID: Kylie Castillo, female    DOB: [birth year]  Age: 49 y.o. MRN: [id number]  CC:  Chief Complaint  Patient presents with  . Arm Pain    left arm pain post fall. Still having arm pain. Can move arm but can not twist lower arm. DOI 08/01/19    HPI Kylie Castillo presents left forearm pain after falling while  Cleaning her  yard-stepped on concrete block-pt fell on top of the block on her forearm with full body weight. Now pt has  pain  with rotation of her arm. No numbness. No pain in the wrist or hand. Swelling and bruising where she fell on her arm. FROM of shoulder , wrist and hand.   Past Medical History:  Diagnosis Date  . Allergy     Past Surgical History:  Procedure Laterality Date  . HAND SURGERY Right   . TUBAL LIGATION      Family History  Problem Relation Age of Onset  . Stroke Mother   . Heart disease Father   . Diabetes Father     Social History   Socioeconomic History  . Marital status: Married    Spouse name: Not on file  . Number of children: Not on file  . Years of education: Not on file  . Highest education level: Not on file  Occupational History  . Not on file  Tobacco Use  . Smoking status: Former Smoker    Packs/day: 1.00    Types: Cigarettes    Quit date: 12/27/2016    Years since quitting: 2.5  . Smokeless tobacco: Never Used  Substance and Sexual Activity  . Alcohol use: Not on file    Alcohol/week: 0.0 standard drinks  . Drug use: Never  . Sexual activity: Not on file  Other Topics Concern  . Not on file  Social History Narrative  . Not on file   Social Determinants of Health   Financial Resource Strain:   . Difficulty of Paying Living Expenses:   Food Insecurity:   . Worried About Charity fundraiser in the Last Year:   . Arboriculturist in the Last Year:   Transportation Needs:   . Film/video editor (Medical):   Marland Kitchen Lack of Transportation (Non-Medical):   Physical  Activity:   . Days of Exercise per Week:   . Minutes of Exercise per Session:   Stress:   . Feeling of Stress :   Social Connections:   . Frequency of Communication with Friends and Family:   . Frequency of Social Gatherings with Friends and Family:   . Attends Religious Services:   . Active Member of Clubs or Organizations:   . Attends Archivist Meetings:   Marland Kitchen Marital Status:   Intimate Partner Violence:   . Fear of Current or Ex-Partner:   . Emotionally Abused:   Marland Kitchen Physically Abused:   . Sexually Abused:     Outpatient Medications Prior to Visit  Medication Sig Dispense Refill  . Albuterol Sulfate (PROAIR RESPICLICK) 123XX123 (90 BASE) MCG/ACT AEPB Inhale 2 puffs into the lungs every 4 (four) hours as needed. (Patient not taking: Reported on 12/28/2018) 1 each 2  . amoxicillin-clavulanate (AUGMENTIN) 875-125 MG tablet Take 1 tablet by mouth 2 (two) times daily. (Patient not taking: Reported on 12/28/2018) 14 tablet 0  . azithromycin (ZITHROMAX) 500 MG tablet Take 1 tablet (500 mg total) by mouth daily. (Patient not  taking: Reported on 12/28/2018) 3 tablet 0  . baclofen (LIORESAL) 10 MG tablet Take 1 tablet (10 mg total) by mouth every 8 (eight) hours as needed for muscle spasms. (Patient not taking: Reported on 12/28/2018) 30 each 0  . benzonatate (TESSALON) 200 MG capsule Take 1 capsule (200 mg total) by mouth 2 (two) times daily as needed for cough. (Patient not taking: Reported on 12/28/2018) 20 capsule 0  . cephALEXin (KEFLEX) 500 MG capsule Take 1 capsule (500 mg total) by mouth 2 (two) times daily. 10 capsule 0  . cyclobenzaprine (FLEXERIL) 10 MG tablet Take 1 tablet (10 mg total) by mouth 3 (three) times daily as needed for muscle spasms. (Patient not taking: Reported on 12/28/2018) 30 tablet 0  . dextromethorphan (DELSYM) 30 MG/5ML liquid Take 5 mLs (30 mg total) by mouth 2 (two) times daily. (Patient not taking: Reported on 12/28/2018) 148 mL 0  . etodolac (LODINE) 300 MG  capsule Take 1 capsule (300 mg total) by mouth 2 (two) times daily. (Patient not taking: Reported on 12/28/2018) 20 capsule 0  . fluticasone (FLONASE) 50 MCG/ACT nasal spray Place 1-2 sprays into both nostrils 2 (two) times daily. (Patient not taking: Reported on 03/17/2015) 16 g 6  . HYDROcodone-homatropine (HYCODAN) 5-1.5 MG/5ML syrup Take 5 mLs by mouth every 8 (eight) hours as needed for cough. (Patient not taking: Reported on 12/28/2018) 120 mL 0  . mupirocin ointment (BACTROBAN) 2 % Place 1 application into the nose 2 (two) times daily. 22 g 0  . nitrofurantoin, macrocrystal-monohydrate, (MACROBID) 100 MG capsule Take 1 capsule (100 mg total) by mouth 2 (two) times daily. (Patient not taking: Reported on 12/28/2018) 10 capsule 0  . nitrofurantoin, macrocrystal-monohydrate, (MACROBID) 100 MG capsule Take 1 capsule (100 mg total) by mouth 2 (two) times daily. (Patient not taking: Reported on 12/28/2018) 20 capsule 0  . sulfamethoxazole-trimethoprim (BACTRIM) 400-80 MG tablet Take 1 tablet by mouth 2 (two) times daily. 14 tablet 0   No facility-administered medications prior to visit.    Allergies  Allergen Reactions  . Iodine     REACTION: Swelling if on skin In system severe bradycardia - states when she had twins, had interanal exam vaginally with this and became hypotensive with severe swelling.  . Varenicline Tartrate     REACTION: Mean and angry    ROS Review of Systems  Musculoskeletal:       Forearm-swelling, tenderness       Objective:    Physical Exam  Musculoskeletal:     Left forearm: Swelling and tenderness present.     Comments: Forearm-ecchymosis, eryth, point tenderness to palpation    BP 131/80 (BP Location: Right Arm, Patient Position: Sitting, Cuff Size: Normal)   Pulse 80   Temp 98 F (36.7 C) (Temporal)   Ht 5\' 5"  (1.651 m)   Wt 155 lb 6.4 oz (70.5 kg)   LMP 02/21/2015   SpO2 98%   BMI 25.86 kg/m  Wt Readings from Last 3 Encounters:  08/02/19 155 lb  6.4 oz (70.5 kg)  01/19/19 146 lb (66.2 kg)  12/28/18 147 lb 12.8 oz (67 kg)     Health Maintenance Due  Topic Date Due  . TETANUS/TDAP  Never done    Lab Results  Component Value Date   WBC 9.1 12/29/2007   HGB 14.5 12/29/2007   HCT 44.5 12/29/2007   MCV 89.9 12/29/2007   PLT 312 12/29/2007   Lab Results  Component Value Date   NA 139 04/28/2019  K 4.3 04/28/2019   CO2 28 04/28/2019   GLUCOSE 83 04/28/2019   BUN 8 04/28/2019   CREATININE 0.99 04/28/2019   BILITOT 0.6 04/28/2019   AST 15 04/28/2019   ALT 10 04/28/2019   PROT 6.6 04/28/2019   CALCIUM 9.7 04/28/2019   Lab Results  Component Value Date   CHOL 186 04/28/2019   Lab Results  Component Value Date   HDL 46 (L) 04/28/2019   Lab Results  Component Value Date   LDLCALC 119 (H) 04/28/2019   Lab Results  Component Value Date   TRIG 105 04/28/2019   Lab Results  Component Value Date   CHOLHDL 4.0 04/28/2019     Assessment & Plan:   1. Pain of left upper extremity - DG Forearm Left; Future Ice, Aleve xray Follow-up: prn  Kaz Auld Hannah Beat, MD

## 2019-09-17 ENCOUNTER — Encounter: Payer: Self-pay | Admitting: Family Medicine

## 2020-01-18 ENCOUNTER — Other Ambulatory Visit: Payer: Self-pay

## 2020-01-18 ENCOUNTER — Encounter: Payer: Self-pay | Admitting: Family Medicine

## 2020-01-18 ENCOUNTER — Ambulatory Visit: Payer: 59 | Admitting: Family Medicine

## 2020-01-18 VITALS — BP 127/79 | HR 80 | Temp 98.1°F | Ht 64.0 in | Wt 148.2 lb

## 2020-01-18 DIAGNOSIS — R232 Flushing: Secondary | ICD-10-CM

## 2020-01-18 DIAGNOSIS — D229 Melanocytic nevi, unspecified: Secondary | ICD-10-CM

## 2020-01-18 DIAGNOSIS — M2550 Pain in unspecified joint: Secondary | ICD-10-CM | POA: Diagnosis not present

## 2020-01-18 MED ORDER — DULOXETINE HCL 30 MG PO CPEP: 30.0000 mg | ORAL_CAPSULE | Freq: Every day | ORAL | 2 refills | Status: DC

## 2020-01-18 MED FILL — DULoxetine HCL 30 MG CPEP: 30 | 30 days supply | Qty: 30 | Fill #0

## 2020-01-18 NOTE — Progress Notes (Signed)
New Patient Office Visit  Assessment & Plan:  1. Hot flashes - Uncontrolled. Started patient on Cymbalta today.  - DULoxetine (CYMBALTA) 30 MG capsule; Take 1 capsule (30 mg total) by mouth daily.  Dispense: 30 capsule; Refill: 2  2. Polyarthralgia - Uncontrolled. Started patient on Cymbalta today.  - DULoxetine (CYMBALTA) 30 MG capsule; Take 1 capsule (30 mg total) by mouth daily.  Dispense: 30 capsule; Refill: 2  3. Atypical mole - Ambulatory referral to Dermatology   Follow-up: Return in about 3 months (around 04/18/2020) for joint pain & hot flashes.   Hendricks Limes, MSN, APRN, FNP-C [facility]  Subjective:  Patient ID: Kylie Castillo, female    DOB: [birth year]  Age: 49 y.o. MRN: [id number]  Patient Care Team: Loman Brooklyn, FNP as PCP - General (Family Medicine)  CC:  Chief Complaint  Patient presents with  . New Patient (Initial Visit)    Dr. Holly Bodily   . Establish Care  . Hot Flashes    Patient states it has been going on 8-9 months.  . Joint Pain    Has been going on since 2015.  Marland Kitchen Nevus    Patient states there a few moles on her back that she would liked checked.    HPI Kylie Castillo presents to establish care.  Patient is transferring from Dr. Holly Bodily as she is no longer working in [place].  Patient reports hot flashes that have been going on for the past 8 or 9 months.  They are worse at night.  She went through menopause 2 years ago.  She has tried multiple over-the-counter treatment regimens including black cohosh and Estroven.  There are some others that she has tried but she cannot recall their names.  Patient reports joint pain since 2015.  2016 and 2017 were really bad.  Most of her pain occurs in her knees, elbows, and wrists.  At one point she had an elevated ANA and was told she had lupus but when she went to see a rheumatologist and had labs repeated everything was normal.  Patient takes ibuprofen as needed for  pain.  Patient would like moles on her back checked since she is unable to see them.  She does report itching to them.   Review of Systems  Constitutional: Negative for chills, fever, malaise/fatigue and weight loss.  HENT: Negative for congestion, ear discharge, ear pain, nosebleeds, sinus pain, sore throat and tinnitus.   Eyes: Negative for blurred vision, double vision, pain, discharge and redness.  Respiratory: Negative for cough, shortness of breath and wheezing.   Cardiovascular: Negative for chest pain, palpitations and leg swelling.  Gastrointestinal: Negative for abdominal pain, constipation, diarrhea, heartburn, nausea and vomiting.  Genitourinary: Negative for dysuria, frequency and urgency.  Musculoskeletal: Positive for joint pain. Negative for myalgias.  Skin: Negative for rash.  Neurological: Negative for dizziness, seizures, weakness and headaches.  Psychiatric/Behavioral: Negative for depression, substance abuse and suicidal ideas. The patient is not nervous/anxious.     Current Outpatient Medications:  .  BEE POLLEN PO, Take by mouth., Disp: , Rfl:  .  Cyanocobalamin (B-12) 2500 MCG TABS, Take 1 tablet by mouth daily., Disp: , Rfl:   Allergies  Allergen Reactions  . Iodine     REACTION: Swelling if on skin In system severe bradycardia - states when she had twins, had interanal exam vaginally with this and became hypotensive with severe swelling.  . Varenicline Tartrate     REACTION: Mean  and angry    Past Medical History:  Diagnosis Date  . Allergy     Past Surgical History:  Procedure Laterality Date  . HAND SURGERY Right   . TUBAL LIGATION    . WISDOM TOOTH EXTRACTION      Family History  Problem Relation Age of Onset  . Stroke Mother   . Hyperlipidemia Mother   . Heart disease Father   . Diabetes Father   . Hypertension Father   . Hyperlipidemia Father   . Alcohol abuse Brother   . Stroke Brother   . Ovarian cancer Maternal Aunt   . Anxiety  disorder Maternal Aunt   . Depression Maternal Aunt   . Heart disease Maternal Aunt   . Colon cancer Maternal Grandmother   . Lung cancer Maternal Grandfather   . Heart disease Maternal Grandfather   . Hyperlipidemia Maternal Grandfather   . Heart attack Paternal Grandfather   . Heart disease Paternal Grandfather   . Hyperlipidemia Paternal Grandfather     Social History   Socioeconomic History  . Marital status: Married    Spouse name: Not on file  . Number of children: Not on file  . Years of education: Not on file  . Highest education level: Not on file  Occupational History  . Not on file  Tobacco Use  . Smoking status: Former Smoker    Packs/day: 1.00    Types: Cigarettes    Quit date: 08/01/2016    Years since quitting: 3.4  . Smokeless tobacco: Never Used  Vaping Use  . Vaping Use: Never used  Substance and Sexual Activity  . Alcohol use: Not Currently    Alcohol/week: 0.0 standard drinks  . Drug use: Never  . Sexual activity: Yes    Birth control/protection: Surgical  Other Topics Concern  . Not on file  Social History Narrative  . Not on file   Social Determinants of Health   Financial Resource Strain:   . Difficulty of Paying Living Expenses: Not on file  Food Insecurity:   . Worried About Charity fundraiser in the Last Year: Not on file  . Ran Out of Food in the Last Year: Not on file  Transportation Needs:   . Lack of Transportation (Medical): Not on file  . Lack of Transportation (Non-Medical): Not on file  Physical Activity:   . Days of Exercise per Week: Not on file  . Minutes of Exercise per Session: Not on file  Stress:   . Feeling of Stress : Not on file  Social Connections:   . Frequency of Communication with Friends and Family: Not on file  . Frequency of Social Gatherings with Friends and Family: Not on file  . Attends Religious Services: Not on file  . Active Member of Clubs or Organizations: Not on file  . Attends Theatre manager Meetings: Not on file  . Marital Status: Not on file  Intimate Partner Violence:   . Fear of Current or Ex-Partner: Not on file  . Emotionally Abused: Not on file  . Physically Abused: Not on file  . Sexually Abused: Not on file    Objective:   Today's Vitals: BP 127/79   Pulse 80   Temp 98.1 F (36.7 C) (Temporal)   Ht 5\' 4"  (1.626 m)   Wt 148 lb 3.2 oz (67.2 kg)   LMP 02/21/2015   SpO2 99%   BMI 25.44 kg/m   Physical Exam Vitals reviewed.  Constitutional:  General: She is not in acute distress.    Appearance: Normal appearance. She is normal weight. She is not ill-appearing, toxic-appearing or diaphoretic.  HENT:     Head: Normocephalic and atraumatic.  Eyes:     General: No scleral icterus.       Right eye: No discharge.        Left eye: No discharge.     Conjunctiva/sclera: Conjunctivae normal.  Cardiovascular:     Rate and Rhythm: Normal rate and regular rhythm.     Heart sounds: Normal heart sounds. No murmur heard.  No friction rub. No gallop.   Pulmonary:     Effort: Pulmonary effort is normal. No respiratory distress.     Breath sounds: Normal breath sounds. No stridor. No wheezing, rhonchi or rales.  Musculoskeletal:        General: Normal range of motion.     Cervical back: Normal range of motion.  Skin:    General: Skin is warm and dry.     Capillary Refill: Capillary refill takes less than 2 seconds.     Comments: Patient has two moles on the upper right side of her back. The top one is round, smooth, and symmetrical; the one below is irregular and bumpy.   Neurological:     General: No focal deficit present.     Mental Status: She is alert and oriented to person, place, and time. Mental status is at baseline.  Psychiatric:        Mood and Affect: Mood normal.        Behavior: Behavior normal.        Thought Content: Thought content normal.        Judgment: Judgment normal.

## 2020-01-19 ENCOUNTER — Encounter: Payer: 59 | Admitting: Family Medicine

## 2020-02-02 DIAGNOSIS — I781 Nevus, non-neoplastic: Secondary | ICD-10-CM | POA: Diagnosis not present

## 2020-02-02 DIAGNOSIS — D235 Other benign neoplasm of skin of trunk: Secondary | ICD-10-CM | POA: Diagnosis not present

## 2020-02-02 DIAGNOSIS — L821 Other seborrheic keratosis: Secondary | ICD-10-CM | POA: Diagnosis not present

## 2020-02-02 DIAGNOSIS — D3612 Benign neoplasm of peripheral nerves and autonomic nervous system, upper limb, including shoulder: Secondary | ICD-10-CM | POA: Diagnosis not present

## 2020-02-02 DIAGNOSIS — D225 Melanocytic nevi of trunk: Secondary | ICD-10-CM | POA: Diagnosis not present

## 2020-02-14 MED FILL — DULoxetine HCL 30 MG CPEP: 30 | 30 days supply | Qty: 30 | Fill #1

## 2020-03-13 IMAGING — MG DIGITAL SCREENING BILAT W/ TOMO W/ CAD
6 of 10 series · 6 of 30 positions shown · non-contrast
Comparison: None.

CLINICAL DATA: Screening. This is the patient's initial baseline
mammogram.

EXAM:
DIGITAL SCREENING BILATERAL MAMMOGRAM WITH TOMO AND CAD

[R CC synth-2D]
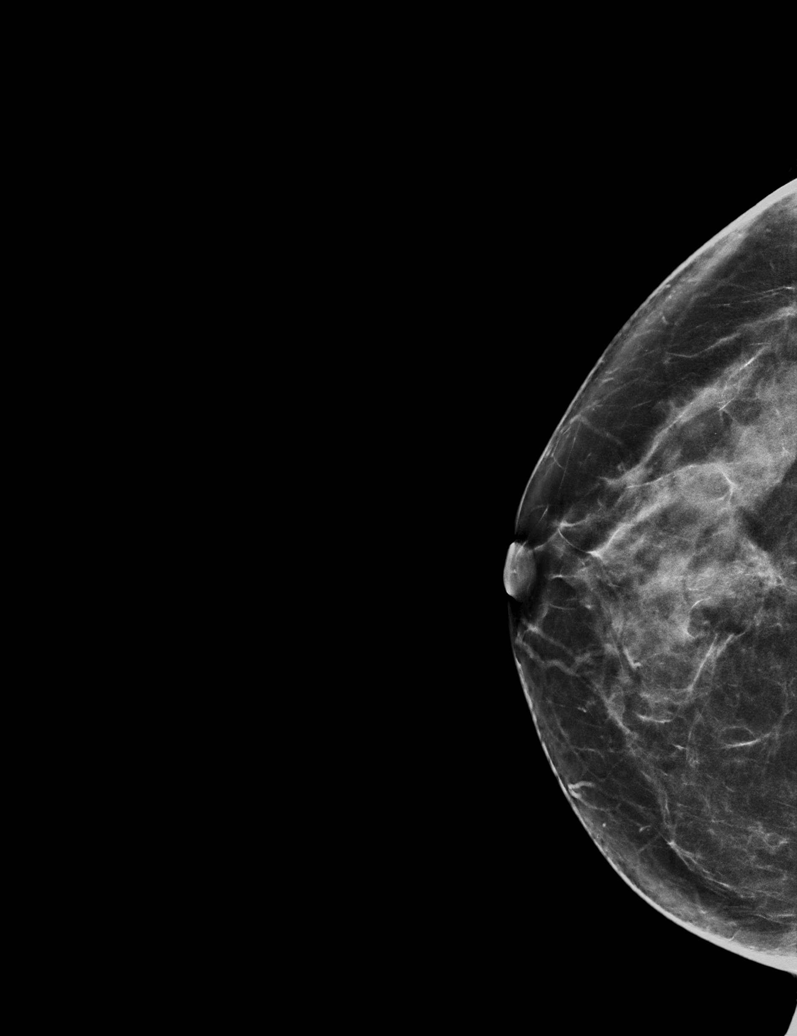

[L CC synth-2D]
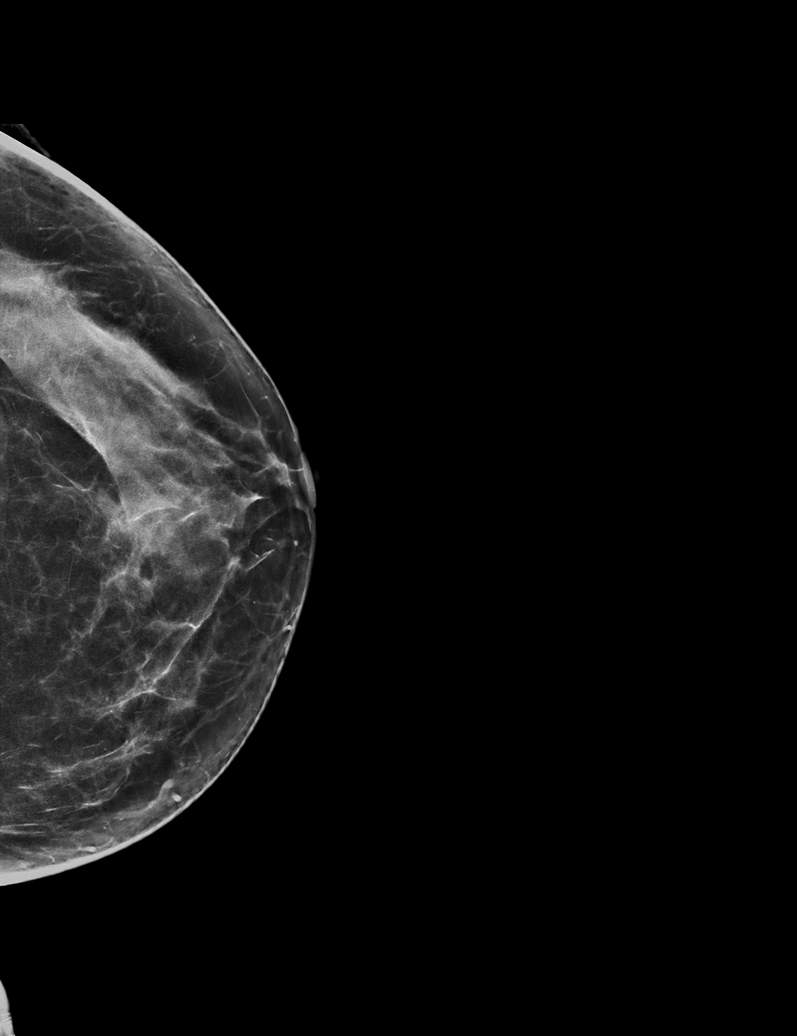

[R MLO synth-2D (1 of 2)]
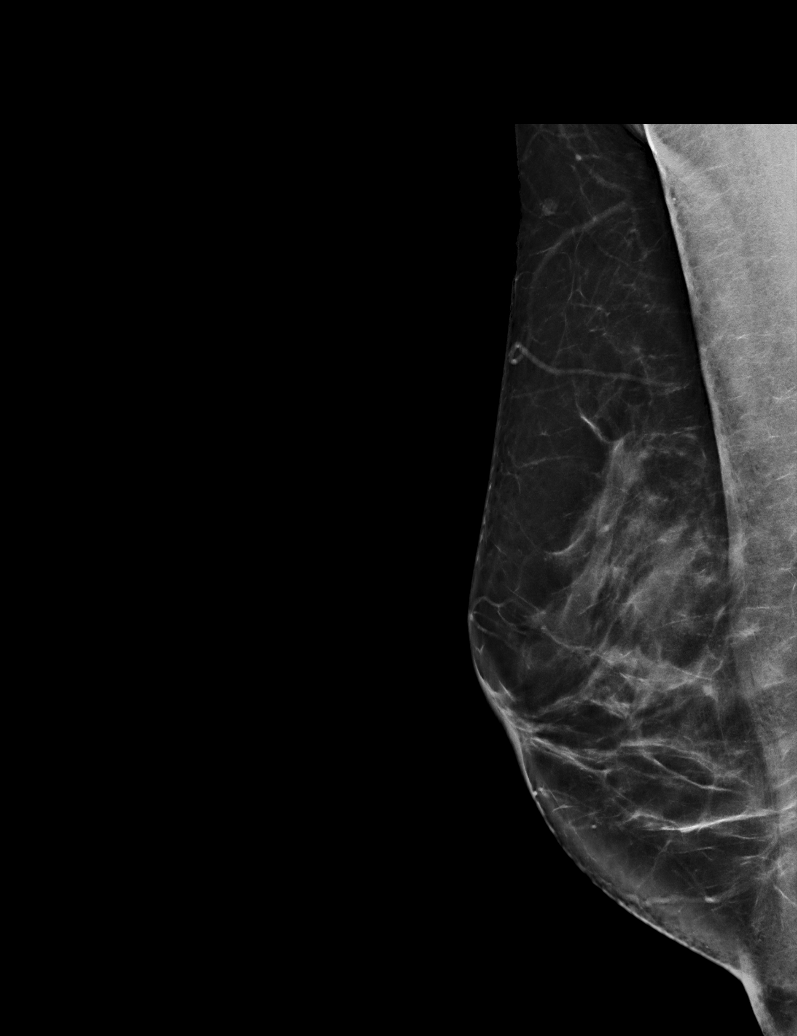

[R MLO synth-2D (2 of 2)]
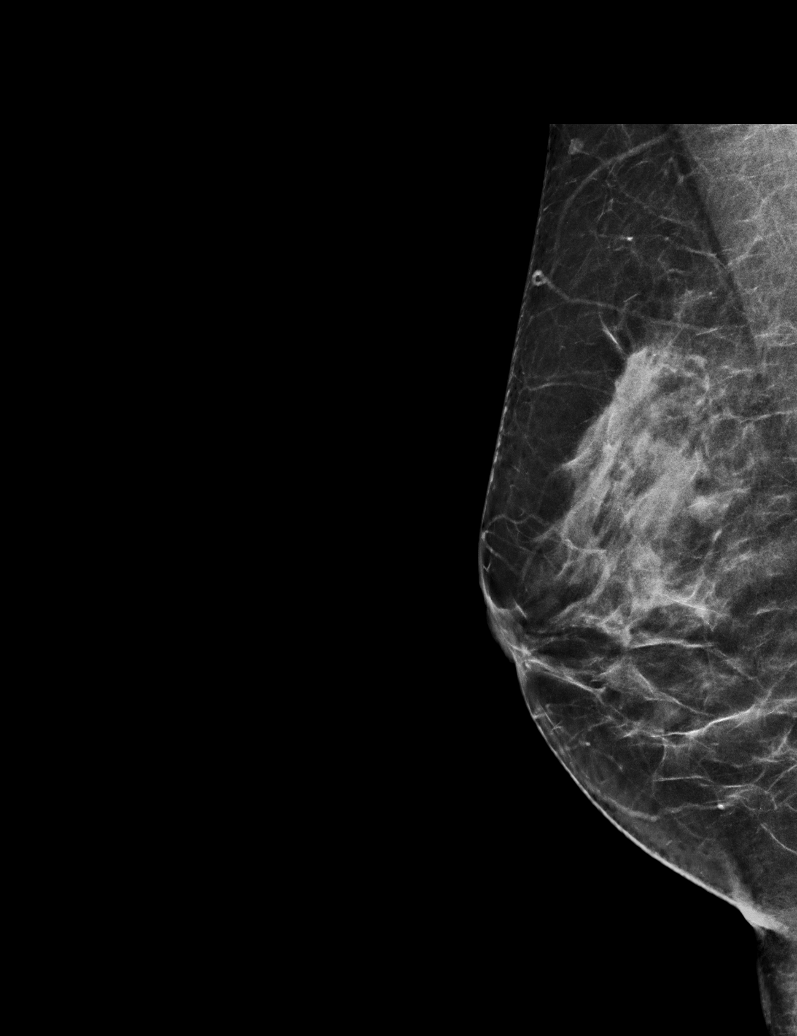

[L MLO synth-2D]
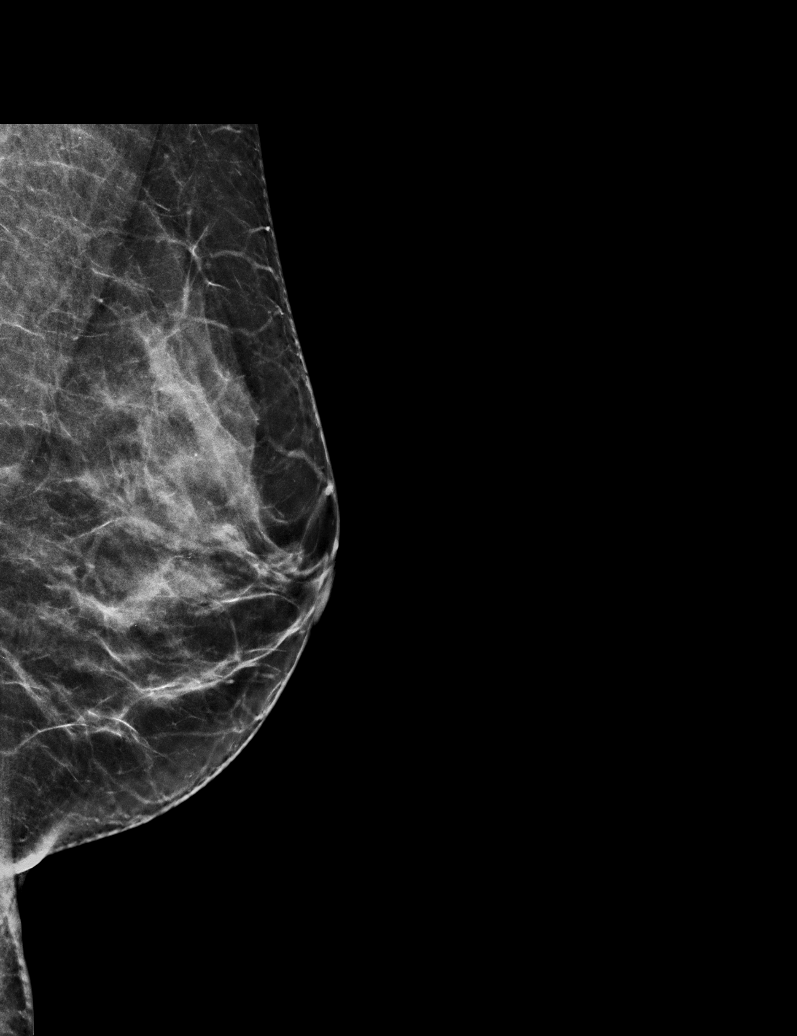

[L MLO tomo · tomo slice 29/58.0]
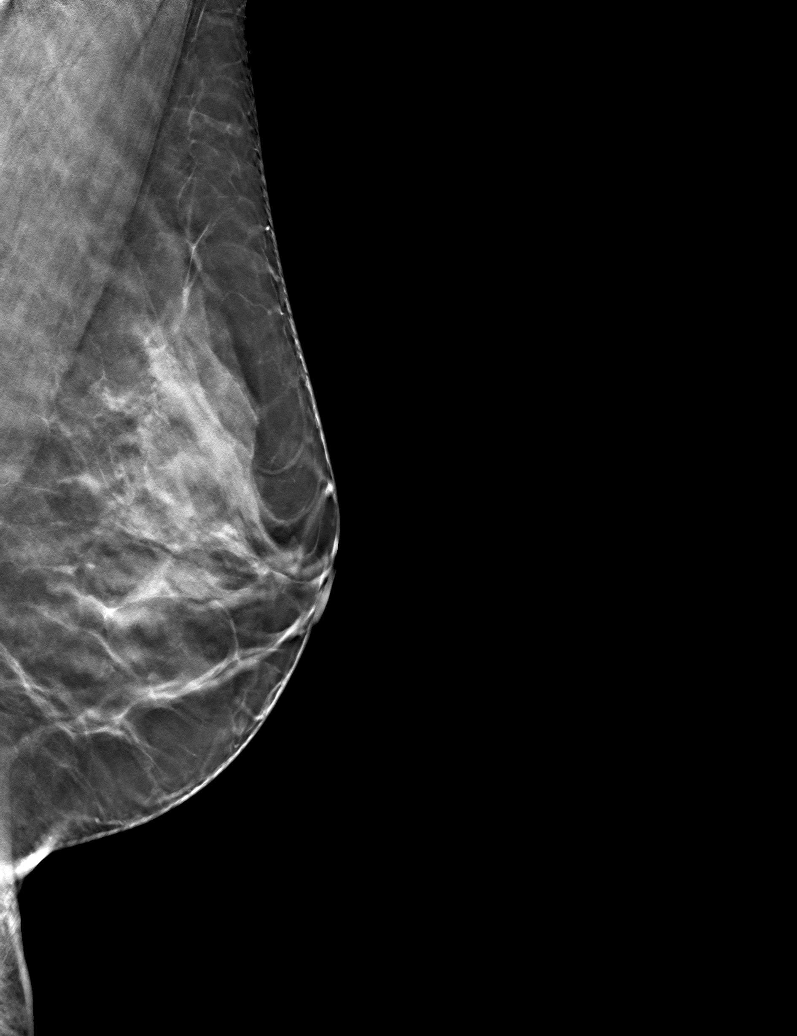

[6 of 30 positions shown; findings below may reference images not displayed]

ACR Breast Density Category c: The breast tissue is heterogeneously
dense, which may obscure small masses
FINDINGS: There are no findings suspicious for malignancy. Images were
processed with CAD.
IMPRESSION: No mammographic evidence of malignancy. A result letter of this
screening mammogram will be mailed directly to the patient.

RECOMMENDATION:
Screening mammogram in one year. (Code:XQ-N-FMR)

BI-RADS CATEGORY  1: Negative.

## 2020-03-15 MED FILL — DULoxetine HCL 30 MG CPEP: 30 | 30 days supply | Qty: 30 | Fill #2

## 2020-04-18 ENCOUNTER — Encounter: Payer: Self-pay | Admitting: Family

## 2020-04-18 ENCOUNTER — Other Ambulatory Visit: Payer: Self-pay

## 2020-04-18 ENCOUNTER — Ambulatory Visit: Payer: 59 | Admitting: Family

## 2020-04-18 ENCOUNTER — Other Ambulatory Visit: Payer: Self-pay | Admitting: Family

## 2020-04-18 VITALS — BP 125/83 | HR 80 | Temp 97.6°F | Ht 64.0 in | Wt 151.0 lb

## 2020-04-18 DIAGNOSIS — Z23 Encounter for immunization: Secondary | ICD-10-CM

## 2020-04-18 DIAGNOSIS — R232 Flushing: Secondary | ICD-10-CM

## 2020-04-18 DIAGNOSIS — M2550 Pain in unspecified joint: Secondary | ICD-10-CM

## 2020-04-18 MED ORDER — DULOXETINE HCL 60 MG PO CPEP: 60.0000 mg | ORAL_CAPSULE | Freq: Every day | ORAL | 3 refills | Status: DC

## 2020-04-18 MED ORDER — DICLOFENAC SODIUM 75 MG PO TBEC: 75.0000 mg | DELAYED_RELEASE_TABLET | Freq: Two times a day (BID) | ORAL | 2 refills | Status: DC

## 2020-04-18 MED FILL — DICLOFENAC SODIUM 75 MG TAB: 75 | 90 days supply | Qty: 180 | Fill #0

## 2020-04-18 MED FILL — DULOXETINE HCL 60 MG CPEP: 60 | 90 days supply | Qty: 90 | Fill #0

## 2020-04-18 NOTE — Progress Notes (Signed)
Subjective:    Patient ID: Kylie Castillo, female    DOB: May 06, 1971, 49 y.o.   MRN: [id number]  Chief Complaint  Patient presents with  . Medical Management of Chronic Issues  . Joint Pain    wrist, knees, hips been going on for years   . Hot Flashes   Pt presents to the office for follow up on hot flashes and joint pain. She was seen on 01/08/20 and started on Cymbalta 30 mg. She reports this has helped with her hot flashes and is sleeping through the night. She reports she is having 1-2 hot flashes a day vs constantly every 10 mins.  Arthritis Presents for follow-up visit. She complains of pain and stiffness. Affected locations include the left knee, right knee, right hip, left hip, left wrist and right wrist.      Review of Systems  Musculoskeletal: Positive for arthritis and stiffness.  All other systems reviewed and are negative.      Objective:   Physical Exam Vitals reviewed.  Constitutional:      General: She is not in acute distress.    Appearance: She is well-developed.  HENT:     Head: Normocephalic and atraumatic.     Right Ear: Tympanic membrane normal.     Left Ear: Tympanic membrane normal.  Eyes:     Pupils: Pupils are equal, round, and reactive to light.  Neck:     Thyroid: No thyromegaly.  Cardiovascular:     Rate and Rhythm: Normal rate and regular rhythm.     Heart sounds: Normal heart sounds. No murmur heard.   Pulmonary:     Effort: Pulmonary effort is normal. No respiratory distress.     Breath sounds: Normal breath sounds. No wheezing.  Abdominal:     General: Bowel sounds are normal. There is no distension.     Palpations: Abdomen is soft.     Tenderness: There is no abdominal tenderness.  Musculoskeletal:        General: No tenderness. Normal range of motion.     Cervical back: Normal range of motion and neck supple.  Skin:    General: Skin is warm and dry.  Neurological:     Mental Status: She is alert and oriented to person, place,  and time.     Cranial Nerves: No cranial nerve deficit.     Deep Tendon Reflexes: Reflexes are normal and symmetric.  Psychiatric:        Behavior: Behavior normal.        Thought Content: Thought content normal.        Judgment: Judgment normal.       BP 125/83   Pulse 80   Temp 97.6 F (36.4 C) (Temporal)   Ht 5\' 4"  (1.626 m)   Wt 151 lb (68.5 kg)   LMP 02/21/2015   SpO2 97%   BMI 25.92 kg/m      Assessment & Plan:  Kylie Castillo comes in today with chief complaint of Medical Management of Chronic Issues, Joint Pain (wrist, knees, hips been going on for years ), and Hot Flashes   Diagnosis and orders addressed:  1. Generalized joint pain Will start diclofenac BID  Take with food No other NSAID's - diclofenac (VOLTAREN) 75 MG EC tablet; Take 1 tablet (75 mg total) by mouth 2 (two) times daily.  Dispense: 180 tablet; Refill: 2  2. Hot flashes Will increase Cymbalta to 60 mg from 30 mg  RTO in 4-6 weeks -  DULoxetine (CYMBALTA) 60 MG capsule; Take 1 capsule (60 mg total) by mouth daily.  Dispense: 90 capsule; Refill: 3  3. Polyarthralgia - diclofenac (VOLTAREN) 75 MG EC tablet; Take 1 tablet (75 mg total) by mouth 2 (two) times daily.  Dispense: 180 tablet; Refill: 2  4. Need for diphtheria-tetanus-pertussis (Tdap) vaccine - Tdap vaccine greater than or equal to 7yo IM   Labs pending Health Maintenance reviewed Diet and exercise encouraged  Follow up plan: 4-6 weeks to recheck hot flashes   Evelina Dun, FNP

## 2020-04-18 NOTE — Patient Instructions (Signed)
Arthritis °Arthritis is a term that is commonly used to refer to joint pain or joint disease. There are more than 100 types of arthritis. °What are the causes? °The most common cause of this condition is wear and tear of a joint. Other causes include: °· Gout. °· Inflammation of a joint. °· An infection of a joint. °· Sprains and other injuries near the joint. °· A reaction to medicines or drugs, or an allergic reaction. °In some cases, the cause may not be known. °What are the signs or symptoms? °The main symptom of this condition is pain in the joint during movement. Other symptoms include: °· Redness, swelling, or stiffness at a joint. °· Warmth coming from the joint. °· Fever. °· Overall feeling of illness. °How is this diagnosed? °This condition may be diagnosed with a physical exam and tests, including: °· Blood tests. °· Urine tests. °· Imaging tests, such as X-rays, an MRI, or a CT scan. °Sometimes, fluid is removed from a joint for testing. °How is this treated? °This condition may be treated with: °· Treatment of the cause, if it is known. °· Rest. °· Raising (elevating) the joint. °· Applying cold or hot packs to the joint. °· Medicines to improve symptoms and reduce inflammation. °· Injections of a steroid such as cortisone into the joint to help reduce pain and inflammation. °Depending on the cause of your arthritis, you may need to make lifestyle changes to reduce stress on your joint. Changes may include: °· Exercising more. °· Losing weight. °Follow these instructions at home: °Medicines °· Take over-the-counter and prescription medicines only as told by your health care provider. °· Do not take aspirin to relieve pain if your health care provider thinks that gout may be causing your pain. °Activity °· Rest your joint if told by your health care provider. Rest is important when your disease is active and your joint feels painful, swollen, or stiff. °· Avoid activities that make the pain worse. It is  important to balance activity with rest. °· Exercise your joint regularly with range-of-motion exercises as told by your health care provider. Try doing low-impact exercise, such as: °? Swimming. °? Water aerobics. °? Biking. °? Walking. °Managing pain, stiffness, and swelling ° °  ° °· If directed, put ice on the joint. °? Put ice in a plastic bag. °? Place a towel between your skin and the bag. °? Leave the ice on for 20 minutes, 2-3 times per day. °· If your joint is swollen, raise (elevate) it above the level of your heart if directed by your health care provider. °· If your joint feels stiff in the morning, try taking a warm shower. °· If directed, apply heat to the affected area as often as told by your health care provider. Use the heat source that your health care provider recommends, such as a moist heat pack or a heating pad. If you have diabetes, do not apply heat without permission from your health care provider. To apply heat: °? Place a towel between your skin and the heat source. °? Leave the heat on for 20-30 minutes. °? Remove the heat if your skin turns bright red. This is especially important if you are unable to feel pain, heat, or cold. You may have a greater risk of getting burned. °General instructions °· Do not use any products that contain nicotine or tobacco, such as cigarettes, e-cigarettes, and chewing tobacco. If you need help quitting, ask your health care provider. °· Keep   all follow-up visits as told by your health care provider. This is important. °Contact a health care provider if: °· The pain gets worse. °· You have a fever. °Get help right away if: °· You develop severe joint pain, swelling, or redness. °· Many joints become painful and swollen. °· You develop severe back pain. °· You develop severe weakness in your leg. °· You cannot control your bladder or bowels. °Summary °· Arthritis is a term that is commonly used to refer to joint pain or joint disease. There are more than  100 types of arthritis. °· The most common cause of this condition is wear and tear of a joint. Other causes include gout, inflammation or infection of the joint, sprains, or allergies. °· Symptoms of this condition include redness, swelling, or stiffness of the joint. Other symptoms include warmth, fever, or feeling ill. °· This condition is treated with rest, elevation, medicines, and applying cold or hot packs. °· Follow your health care provider's instructions about medicines, activity, exercises, and other home care treatments. °This information is not intended to replace advice given to you by your health care provider. Make sure you discuss any questions you have with your health care provider. °Document Revised: 04/13/2018 Document Reviewed: 04/13/2018 °Elsevier Patient Education © 2020 [employer] ° °

## 2020-05-10 ENCOUNTER — Other Ambulatory Visit (HOSPITAL_COMMUNITY): Payer: Self-pay | Admitting: Family

## 2020-05-10 DIAGNOSIS — Z1231 Encounter for screening mammogram for malignant neoplasm of breast: Secondary | ICD-10-CM

## 2020-05-15 ENCOUNTER — Other Ambulatory Visit (HOSPITAL_COMMUNITY): Payer: Self-pay | Admitting: Family

## 2020-05-15 DIAGNOSIS — Z1231 Encounter for screening mammogram for malignant neoplasm of breast: Secondary | ICD-10-CM

## 2020-05-17 ENCOUNTER — Inpatient Hospital Stay (HOSPITAL_COMMUNITY): Admission: RE | Admit: 2020-05-17 | Payer: 59 | Source: Ambulatory Visit

## 2020-05-17 DIAGNOSIS — Z1231 Encounter for screening mammogram for malignant neoplasm of breast: Secondary | ICD-10-CM

## 2020-05-30 ENCOUNTER — Ambulatory Visit: Payer: 59 | Admitting: Family

## 2020-05-30 ENCOUNTER — Encounter: Payer: Self-pay | Admitting: Family

## 2020-05-30 ENCOUNTER — Other Ambulatory Visit: Payer: Self-pay

## 2020-05-30 VITALS — BP 116/72 | HR 87 | Temp 97.8°F | Ht 65.0 in | Wt 154.6 lb

## 2020-05-30 DIAGNOSIS — Z114 Encounter for screening for human immunodeficiency virus [HIV]: Secondary | ICD-10-CM

## 2020-05-30 DIAGNOSIS — Z0000 Encounter for general adult medical examination without abnormal findings: Secondary | ICD-10-CM

## 2020-05-30 DIAGNOSIS — Z0001 Encounter for general adult medical examination with abnormal findings: Secondary | ICD-10-CM

## 2020-05-30 DIAGNOSIS — Z1211 Encounter for screening for malignant neoplasm of colon: Secondary | ICD-10-CM

## 2020-05-30 DIAGNOSIS — Z1159 Encounter for screening for other viral diseases: Secondary | ICD-10-CM | POA: Diagnosis not present

## 2020-05-30 DIAGNOSIS — R232 Flushing: Secondary | ICD-10-CM

## 2020-05-30 DIAGNOSIS — M2550 Pain in unspecified joint: Secondary | ICD-10-CM

## 2020-05-30 MED ORDER — DULOXETINE HCL 30 MG PO CPEP: 30.0000 mg | ORAL_CAPSULE | Freq: Every day | ORAL | 4 refills | Status: DC

## 2020-05-30 NOTE — Progress Notes (Signed)
Subjective:    Patient ID: Kylie Castillo, female    DOB: 10-Jan-1971, 50 y.o.   MRN: [id number]  Chief Complaint  Patient presents with  . Annual Exam    No concerns, fasting. Patient states the 81m of cymbalta worked better for the hot flashes      HPI Pt presents to the office today CPE without pap. She continues Cymbalta 60 mg daily. She reports she feels like the 30 mg helped her more with herr hot flashes and is sleeping through the night. She reports she is having at least one hot flashes a hour.  She has polyarthralgia that is better when she moving around. She saw a Rheumatologists in 2017, but was given a diagnoses.    Review of Systems  All other systems reviewed and are negative.  Family History  Problem Relation Age of Onset  . Stroke Mother   . Hyperlipidemia Mother   . Heart disease Father   . Diabetes Father   . Hypertension Father   . Hyperlipidemia Father   . Alcohol abuse Brother   . Stroke Brother   . Ovarian cancer Maternal Aunt   . Anxiety disorder Maternal Aunt   . Depression Maternal Aunt   . Heart disease Maternal Aunt   . Colon cancer Maternal Grandmother   . Lung cancer Maternal Grandfather   . Heart disease Maternal Grandfather   . Hyperlipidemia Maternal Grandfather   . Heart attack Paternal Grandfather   . Heart disease Paternal Grandfather   . Hyperlipidemia Paternal Grandfather    Social History   Socioeconomic History  . Marital status: Married    Spouse name: Not on file  . Number of children: Not on file  . Years of education: Not on file  . Highest education level: Not on file  Occupational History  . Not on file  Tobacco Use  . Smoking status: Former Smoker    Packs/day: 1.00    Types: Cigarettes    Quit date: 08/01/2016    Years since quitting: 3.8  . Smokeless tobacco: Never Used  Vaping Use  . Vaping Use: Never used  Substance and Sexual Activity  . Alcohol use: Not Currently    Alcohol/week: 0.0 standard drinks  .  Drug use: Never  . Sexual activity: Yes    Birth control/protection: Surgical  Other Topics Concern  . Not on file  Social History Narrative  . Not on file   Social Determinants of Health   Financial Resource Strain: Not on file  Food Insecurity: Not on file  Transportation Needs: Not on file  Physical Activity: Not on file  Stress: Not on file  Social Connections: Not on file       Objective:   Physical Exam Vitals reviewed.  Constitutional:      General: She is not in acute distress.    Appearance: She is well-developed and well-nourished.  HENT:     Head: Normocephalic and atraumatic.     Right Ear: Tympanic membrane normal.     Left Ear: Tympanic membrane normal.     Mouth/Throat:     Mouth: Oropharynx is clear and moist.  Eyes:     Pupils: Pupils are equal, round, and reactive to light.  Neck:     Thyroid: No thyromegaly.  Cardiovascular:     Rate and Rhythm: Normal rate and regular rhythm.     Pulses: Intact distal pulses.     Heart sounds: Normal heart sounds. No murmur heard.  Pulmonary:     Effort: Pulmonary effort is normal. No respiratory distress.     Breath sounds: Normal breath sounds. No wheezing.  Abdominal:     General: Bowel sounds are normal. There is no distension.     Palpations: Abdomen is soft.     Tenderness: There is no abdominal tenderness.  Musculoskeletal:        General: No tenderness or edema. Normal range of motion.     Cervical back: Normal range of motion and neck supple.  Skin:    General: Skin is warm and dry.  Neurological:     Mental Status: She is alert and oriented to person, place, and time.     Cranial Nerves: No cranial nerve deficit.     Deep Tendon Reflexes: Reflexes are normal and symmetric.  Psychiatric:        Mood and Affect: Mood and affect normal.        Behavior: Behavior normal.        Thought Content: Thought content normal.        Judgment: Judgment normal.          BP 116/72   Pulse 87   Temp  97.8 F (36.6 C) (Temporal)   Ht _0  (1.651 m)   Wt 154 lb 9.6 oz (70.1 kg)   LMP 02/21/2015   SpO2 100%   BMI 25.73 kg/m   Assessment & Plan:  JULLISA GRIGORYAN comes in today with chief complaint of Annual Exam (No concerns, fasting. Patient states the 42m of cymbalta worked better for the hot flashes )   Diagnosis and orders addressed:  1. Annual physical exam - Ambulatory referral to Gastroenterology - Hepatitis C antibody - DULoxetine (CYMBALTA) 30 MG capsule; Take 1 capsule (30 mg total) by mouth daily.  Dispense: 90 capsule; Refill: 4 - CMP14+EGFR - CBC with Differential/Platelet - Lipid panel - TSH - HIV Antibody (routine testing w rflx)  2. Polyarthralgia - DULoxetine (CYMBALTA) 30 MG capsule; Take 1 capsule (30 mg total) by mouth daily.  Dispense: 90 capsule; Refill: 4 - CMP14+EGFR - CBC with Differential/Platelet  3. Hot flashes -Will decrease Cymbalta to 30 mg since patient states this seemed to work better. - DULoxetine (CYMBALTA) 30 MG capsule; Take 1 capsule (30 mg total) by mouth daily.  Dispense: 90 capsule; Refill: 4 - CMP14+EGFR - CBC with Differential/Platelet  4. Colon cancer screening - Ambulatory referral to Gastroenterology - CMP14+EGFR - CBC with Differential/Platelet  5. Need for hepatitis C screening test - Hepatitis C antibody - CMP14+EGFR - CBC with Differential/Platelet  6. Encounter for screening for HIV - CMP14+EGFR - CBC with Differential/Platelet - HIV Antibody (routine testing w rflx)   Labs pending Health Maintenance reviewed Diet and exercise encouraged  Follow up plan: 1 year    CEvelina Dun FNP

## 2020-05-30 NOTE — Patient Instructions (Signed)
Health Maintenance, Female Adopting a healthy lifestyle and getting preventive care are important in promoting health and wellness. Ask your health care provider about:  The right schedule for you to have regular tests and exams.  Things you can do on your own to prevent diseases and keep yourself healthy. What should I know about diet, weight, and exercise? Eat a healthy diet  Eat a diet that includes plenty of vegetables, fruits, low-fat dairy products, and lean protein.  Do not eat a lot of foods that are high in solid fats, added sugars, or sodium.   Maintain a healthy weight Body mass index (BMI) is used to identify weight problems. It estimates body fat based on height and weight. Your health care provider can help determine your BMI and help you achieve or maintain a healthy weight. Get regular exercise Get regular exercise. This is one of the most important things you can do for your health. Most adults should:  Exercise for at least 150 minutes each week. The exercise should increase your heart rate and make you sweat (moderate-intensity exercise).  Do strengthening exercises at least twice a week. This is in addition to the moderate-intensity exercise.  Spend less time sitting. Even light physical activity can be beneficial. Watch cholesterol and blood lipids Have your blood tested for lipids and cholesterol at 49 years of age, then have this test every 5 years. Have your cholesterol levels checked more often if:  Your lipid or cholesterol levels are high.  You are older than 49 years of age.  You are at high risk for heart disease. What should I know about cancer screening? Depending on your health history and family history, you may need to have cancer screening at various ages. This may include screening for:  Breast cancer.  Cervical cancer.  Colorectal cancer.  Skin cancer.  Lung cancer. What should I know about heart disease, diabetes, and high blood  pressure? Blood pressure and heart disease  High blood pressure causes heart disease and increases the risk of stroke. This is more likely to develop in people who have high blood pressure readings, are of African descent, or are overweight.  Have your blood pressure checked: ? Every 3-5 years if you are 18-39 years of age. ? Every year if you are 40 years old or older. Diabetes Have regular diabetes screenings. This checks your fasting blood sugar level. Have the screening done:  Once every three years after age 40 if you are at a normal weight and have a low risk for diabetes.  More often and at a younger age if you are overweight or have a high risk for diabetes. What should I know about preventing infection? Hepatitis B If you have a higher risk for hepatitis B, you should be screened for this virus. Talk with your health care provider to find out if you are at risk for hepatitis B infection. Hepatitis C Testing is recommended for:  Everyone born from 1945 through 1965.  Anyone with known risk factors for hepatitis C. Sexually transmitted infections (STIs)  Get screened for STIs, including gonorrhea and chlamydia, if: ? You are sexually active and are younger than 49 years of age. ? You are older than 49 years of age and your health care provider tells you that you are at risk for this type of infection. ? Your sexual activity has changed since you were last screened, and you are at increased risk for chlamydia or gonorrhea. Ask your health care provider   if you are at risk.  Ask your health care provider about whether you are at high risk for HIV. Your health care provider may recommend a prescription medicine to help prevent HIV infection. If you choose to take medicine to prevent HIV, you should first get tested for HIV. You should then be tested every 3 months for as long as you are taking the medicine. Pregnancy  If you are about to stop having your period (premenopausal) and  you may become pregnant, seek counseling before you get pregnant.  Take 400 to 800 micrograms (mcg) of folic acid every day if you become pregnant.  Ask for birth control (contraception) if you want to prevent pregnancy. Osteoporosis and menopause Osteoporosis is a disease in which the bones lose minerals and strength with aging. This can result in bone fractures. If you are 65 years old or older, or if you are at risk for osteoporosis and fractures, ask your health care provider if you should:  Be screened for bone loss.  Take a calcium or vitamin D supplement to lower your risk of fractures.  Be given hormone replacement therapy (HRT) to treat symptoms of menopause. Follow these instructions at home: Lifestyle  Do not use any products that contain nicotine or tobacco, such as cigarettes, e-cigarettes, and chewing tobacco. If you need help quitting, ask your health care provider.  Do not use street drugs.  Do not share needles.  Ask your health care provider for help if you need support or information about quitting drugs. Alcohol use  Do not drink alcohol if: ? Your health care provider tells you not to drink. ? You are pregnant, may be pregnant, or are planning to become pregnant.  If you drink alcohol: ? Limit how much you use to 0-1 drink a day. ? Limit intake if you are breastfeeding.  Be aware of how much alcohol is in your drink. In the [place] one drink equals one 12 oz bottle of beer (355 mL), one 5 oz glass of wine (148 mL), or one 1 oz glass of hard liquor (44 mL). General instructions  Schedule regular health, dental, and eye exams.  Stay current with your vaccines.  Tell your health care provider if: ? You often feel depressed. ? You have ever been abused or do not feel safe at home. Summary  Adopting a healthy lifestyle and getting preventive care are important in promoting health and wellness.  Follow your health care provider's instructions about healthy  diet, exercising, and getting tested or screened for diseases.  Follow your health care provider's instructions on monitoring your cholesterol and blood pressure. This information is not intended to replace advice given to you by your health care provider. Make sure you discuss any questions you have with your health care provider. Document Revised: 04/29/2018 Document Reviewed: 04/29/2018 Elsevier Patient Education  2021 [employer] Inc.  

## 2020-05-31 LAB — CMP14+EGFR
ALT: 21 IU/L (ref 0–32)
AST: 23 IU/L (ref 0–40)
Albumin/Globulin Ratio: 1.8 (ref 1.2–2.2)
Albumin: 4.3 g/dL (ref 3.8–4.8)
Alkaline Phosphatase: 103 IU/L (ref 44–121)
BUN/Creatinine Ratio: 10 (ref 9–23)
BUN: 10 mg/dL (ref 6–24)
Bilirubin Total: 0.4 mg/dL (ref 0.0–1.2)
CO2: 25 mmol/L (ref 20–29)
Calcium: 9.6 mg/dL (ref 8.7–10.2)
Chloride: 104 mmol/L (ref 96–106)
Creatinine, Ser: 1.01 mg/dL — ABNORMAL HIGH (ref 0.57–1.00)
GFR calc Af Amer: 76 mL/min/{1.73_m2} (ref >59)
GFR calc non Af Amer: 66 mL/min/{1.73_m2} (ref >59)
Globulin, Total: 2.4 g/dL (ref 1.5–4.5)
Glucose: 88 mg/dL (ref 65–99)
Potassium: 4.8 mmol/L (ref 3.5–5.2)
Sodium: 140 mmol/L (ref 134–144)
Total Protein: 6.7 g/dL (ref 6.0–8.5)

## 2020-05-31 LAB — LIPID PANEL
Chol/HDL Ratio: 4.3 ratio (ref 0.0–4.4)
Cholesterol, Total: 215 mg/dL — ABNORMAL HIGH (ref 100–199)
HDL: 50 mg/dL (ref >39)
LDL Chol Calc (NIH): 147 mg/dL — ABNORMAL HIGH (ref 0–99)
Triglycerides: 103 mg/dL (ref 0–149)
VLDL Cholesterol Cal: 18 mg/dL (ref 5–40)

## 2020-05-31 LAB — TSH: TSH: 1.67 u[IU]/mL (ref 0.450–4.500)

## 2020-05-31 LAB — CBC WITH DIFFERENTIAL/PLATELET
Basophils Absolute: 0.1 10*3/uL (ref 0.0–0.2)
Basos: 1 %
EOS (ABSOLUTE): 0.4 10*3/uL (ref 0.0–0.4)
Eos: 6 %
Hematocrit: 42.6 % (ref 34.0–46.6)
Hemoglobin: 14 g/dL (ref 11.1–15.9)
Immature Grans (Abs): 0 10*3/uL (ref 0.0–0.1)
Immature Granulocytes: 0 %
Lymphocytes Absolute: 1.5 10*3/uL (ref 0.7–3.1)
Lymphs: 25 %
MCH: 28.7 pg (ref 26.6–33.0)
MCHC: 32.9 g/dL (ref 31.5–35.7)
MCV: 87 fL (ref 79–97)
Monocytes Absolute: 0.4 10*3/uL (ref 0.1–0.9)
Monocytes: 7 %
Neutrophils Absolute: 3.5 10*3/uL (ref 1.4–7.0)
Neutrophils: 61 %
Platelets: 318 10*3/uL (ref 150–450)
RBC: 4.88 x10E6/uL (ref 3.77–5.28)
RDW: 12 % (ref 11.7–15.4)
WBC: 5.8 10*3/uL (ref 3.4–10.8)

## 2020-05-31 LAB — HIV ANTIBODY (ROUTINE TESTING W REFLEX): HIV Screen 4th Generation wRfx: NONREACTIVE

## 2020-05-31 LAB — HEPATITIS C ANTIBODY: Hep C Virus Ab: <0.1 s/co ratio (ref 0.0–0.9)

## 2020-06-09 DIAGNOSIS — N522 Drug-induced erectile dysfunction: Secondary | ICD-10-CM | POA: Diagnosis not present

## 2020-06-09 DIAGNOSIS — F330 Major depressive disorder, recurrent, mild: Secondary | ICD-10-CM | POA: Diagnosis not present

## 2020-06-09 DIAGNOSIS — E782 Mixed hyperlipidemia: Secondary | ICD-10-CM | POA: Diagnosis not present

## 2020-06-09 DIAGNOSIS — F17210 Nicotine dependence, cigarettes, uncomplicated: Secondary | ICD-10-CM | POA: Diagnosis not present

## 2020-06-26 ENCOUNTER — Ambulatory Visit (HOSPITAL_COMMUNITY): Payer: 59

## 2020-07-07 ENCOUNTER — Ambulatory Visit (HOSPITAL_COMMUNITY): Payer: 59

## 2020-07-11 MED FILL — DICLOFENAC SODIUM 75 MG TAB: 75 | 90 days supply | Qty: 180 | Fill #1

## 2020-07-11 MED FILL — DULOXETINE HCL 60 MG CPEP: 60 | 90 days supply | Qty: 90 | Fill #1

## 2020-07-21 ENCOUNTER — Ambulatory Visit (HOSPITAL_COMMUNITY): Payer: 59

## 2020-07-28 ENCOUNTER — Other Ambulatory Visit: Payer: Self-pay

## 2020-07-28 ENCOUNTER — Ambulatory Visit (HOSPITAL_COMMUNITY)
Admission: RE | Admit: 2020-07-28 | Discharge: 2020-07-28 | Disposition: A | Discharge status: Home / Self Care | Payer: 59 | Source: Ambulatory Visit | Attending: Family | Admitting: Family

## 2020-07-28 DIAGNOSIS — Z1231 Encounter for screening mammogram for malignant neoplasm of breast: Secondary | ICD-10-CM | POA: Diagnosis not present

## 2020-08-04 ENCOUNTER — Ambulatory Visit (AMBULATORY_SURGERY_CENTER): Payer: Self-pay | Admitting: *Deleted

## 2020-08-04 ENCOUNTER — Other Ambulatory Visit: Payer: Self-pay

## 2020-08-04 VITALS — Ht 65.0 in | Wt 155.2 lb

## 2020-08-04 DIAGNOSIS — Z1211 Encounter for screening for malignant neoplasm of colon: Secondary | ICD-10-CM

## 2020-08-04 NOTE — Progress Notes (Signed)
covid vaccines x2   No trouble with anesthesia, denies trouble moving neck, or hx/fam hx of malignant hyperthermia per pt   No egg or soy allergy  No home oxygen use   No medications for weight loss taken  emmi information given  Pt denies constipation issues  Pt informed that we do not do prior authorizations for prep  Plenvu sample given

## 2020-08-09 ENCOUNTER — Other Ambulatory Visit (HOSPITAL_BASED_OUTPATIENT_CLINIC_OR_DEPARTMENT_OTHER): Payer: Self-pay

## 2020-08-17 ENCOUNTER — Encounter: Payer: Self-pay | Admitting: Internal Medicine

## 2020-08-18 ENCOUNTER — Other Ambulatory Visit: Payer: Self-pay

## 2020-08-18 ENCOUNTER — Encounter: Payer: Self-pay | Admitting: Internal Medicine

## 2020-08-18 ENCOUNTER — Ambulatory Visit (AMBULATORY_SURGERY_CENTER): Payer: 59 | Admitting: Internal Medicine

## 2020-08-18 VITALS — BP 112/78 | HR 71 | Temp 97.1°F | Resp 12 | Ht 65.0 in | Wt 153.0 lb

## 2020-08-18 DIAGNOSIS — Z1211 Encounter for screening for malignant neoplasm of colon: Secondary | ICD-10-CM

## 2020-08-18 DIAGNOSIS — D125 Benign neoplasm of sigmoid colon: Secondary | ICD-10-CM

## 2020-08-18 MED ORDER — SODIUM CHLORIDE 0.9 % IV SOLN: 500.0000 mL | Freq: Once | INTRAVENOUS | Status: DC

## 2020-08-18 NOTE — Op Note (Signed)
[facility] Patient Name: Kylie Castillo Procedure Date: 08/18/2020 9:37 AM MRN: [id number] Endoscopist: Jerene Bears , MD Age: 50 Referring MD:  Date of Birth: [birth year] Gender: Female Account #: [id number] Procedure:                Colonoscopy Indications:              Screening for colorectal malignant neoplasm, This                            is the patient's first colonoscopy Medicines:                Monitored Anesthesia Care Procedure:                Pre-Anesthesia Assessment:                           - Prior to the procedure, a History and Physical                            was performed, and patient medications and                            allergies were reviewed. The patient's tolerance of                            previous anesthesia was also reviewed. The risks                            and benefits of the procedure and the sedation                            options and risks were discussed with the patient.                            All questions were answered, and informed consent                            was obtained. Prior Anticoagulants: The patient has                            taken no previous anticoagulant or antiplatelet                            agents. ASA Grade Assessment: II - A patient with                            mild systemic disease. After reviewing the risks                            and benefits, the patient was deemed in                            satisfactory condition to undergo the procedure.  After obtaining informed consent, the colonoscope                            was passed under direct vision. Throughout the                            procedure, the patient's blood pressure, pulse, and                            oxygen saturations were monitored continuously. The                            Olympus PFC-H190DL [id number]) Colonoscope was                            introduced through the anus and  advanced to the                            cecum, identified by appendiceal orifice and                            ileocecal valve. The colonoscopy was performed                            without difficulty. The patient tolerated the                            procedure well. The quality of the bowel                            preparation was excellent. The ileocecal valve,                            appendiceal orifice, and rectum were photographed. Scope In: 9:44:43 AM Scope Out: 10:04:29 AM Scope Withdrawal Time: 0 hours 14 minutes 25 seconds  Total Procedure Duration: 0 hours 19 minutes 46 seconds  Findings:                 The digital rectal exam was normal.                           A 7 mm polyp was found in the sigmoid colon. The                            polyp was semi-pedunculated. The polyp was removed                            with a cold snare. Resection and retrieval were                            complete.                           The exam was otherwise without abnormality on  direct and retroflexion views. Complications:            No immediate complications. Estimated Blood Loss:     Estimated blood loss was minimal. Impression:               - One 7 mm polyp in the sigmoid colon, removed with                            a cold snare. Resected and retrieved.                           - The examination was otherwise normal on direct                            and retroflexion views. Recommendation:           - Patient has a contact number available for                            emergencies. The signs and symptoms of potential                            delayed complications were discussed with the                            patient. Return to normal activities tomorrow.                            Written discharge instructions were provided to the                            patient.                           - Resume previous diet.                            - Continue present medications.                           - Await pathology results.                           - Repeat colonoscopy is recommended. The                            colonoscopy date will be determined after pathology                            results from today's exam become available for                            review. Jerene Bears, MD 08/18/2020 10:06:21 AM This report has been signed electronically.

## 2020-08-18 NOTE — Progress Notes (Signed)
A and O x3. Report to RN. Tolerated MAC anesthesia well.

## 2020-08-18 NOTE — Progress Notes (Signed)
[facility]  Pt's states no medical or surgical changes since previsit or office visit.  

## 2020-08-18 NOTE — Patient Instructions (Signed)
Handout on polyps given. ° °YOU HAD AN ENDOSCOPIC PROCEDURE TODAY AT THE [facility]:   Refer to the procedure report that was given to you for any specific questions about what was found during the examination.  If the procedure report does not answer your questions, please call your gastroenterologist to clarify.  If you requested that your care partner not be given the details of your procedure findings, then the procedure report has been included in a sealed envelope for you to review at your convenience later. ° °YOU SHOULD EXPECT: Some feelings of bloating in the abdomen. Passage of more gas than usual.  Walking can help get rid of the air that was put into your GI tract during the procedure and reduce the bloating. If you had a lower endoscopy (such as a colonoscopy or flexible sigmoidoscopy) you may notice spotting of blood in your stool or on the toilet paper. If you underwent a bowel prep for your procedure, you may not have a normal bowel movement for a few days. ° °Please Note:  You might notice some irritation and congestion in your nose or some drainage.  This is from the oxygen used during your procedure.  There is no need for concern and it should clear up in a day or so. ° °SYMPTOMS TO REPORT IMMEDIATELY: ° °Following lower endoscopy (colonoscopy or flexible sigmoidoscopy): ° Excessive amounts of blood in the stool ° Significant tenderness or worsening of abdominal pains ° Swelling of the abdomen that is new, acute ° Fever of 100°F or higher ° °For urgent or emergent issues, a gastroenterologist can be reached at any hour by calling [phone]. °Do not use MyChart messaging for urgent concerns.  ° ° °DIET:  We do recommend a small meal at first, but then you may proceed to your regular diet.  Drink plenty of fluids but you should avoid alcoholic beverages for 24 hours. ° °ACTIVITY:  You should plan to take it easy for the rest of today and you should NOT DRIVE or use heavy machinery  until tomorrow (because of the sedation medicines used during the test).   ° °FOLLOW UP: °Our staff will call the number listed on your records 48-72 hours following your procedure to check on you and address any questions or concerns that you may have regarding the information given to you following your procedure. If we do not reach you, we will leave a message.  We will attempt to reach you two times.  During this call, we will ask if you have developed any symptoms of COVID 19. If you develop any symptoms (ie: fever, flu-like symptoms, shortness of breath, cough etc.) before then, please call [phone].  If you test positive for Covid 19 in the 2 weeks post procedure, please call and report this information to us.   ° °If any biopsies were taken you will be contacted by phone or by letter within the next 1-3 weeks.  Please call us at [phone] if you have not heard about the biopsies in 3 weeks.  ° ° °SIGNATURES/CONFIDENTIALITY: °You and/or your care partner have signed paperwork which will be entered into your electronic medical record.  These signatures attest to the fact that that the information above on your After Visit Summary has been reviewed and is understood.  Full responsibility of the confidentiality of this discharge information lies with you and/or your care-partner.  °

## 2020-08-21 ENCOUNTER — Other Ambulatory Visit (HOSPITAL_COMMUNITY): Payer: Self-pay

## 2020-08-22 ENCOUNTER — Telehealth: Payer: Self-pay | Admitting: *Deleted

## 2020-08-22 NOTE — Telephone Encounter (Signed)
  Follow up Call-  Call back number 08/18/2020  Post procedure Call Back phone  # [phone]  Permission to leave phone message Yes  Some recent data might be hidden     Patient questions:  Do you have a fever, pain , or abdominal swelling? No. Pain Score  0 *  Have you tolerated food without any problems? Yes.    Have you been able to return to your normal activities? Yes.    Do you have any questions about your discharge instructions: Diet   No. Medications  No. Follow up visit  No.  Do you have questions or concerns about your Care? No.  Actions: * If pain score is 4 or above: No action needed, pain <4.  1. Have you developed a fever since your procedure? no  2.   Have you had an respiratory symptoms (SOB or cough) since your procedure? no  3.   Have you tested positive for COVID 19 since your procedure no  4.   Have you had any family members/close contacts diagnosed with the COVID 19 since your procedure?  no   If yes to any of these questions please route to Joylene John, RN and Joella Prince, RN

## 2020-08-28 ENCOUNTER — Encounter: Payer: Self-pay | Admitting: Internal Medicine

## 2020-10-10 ENCOUNTER — Other Ambulatory Visit (HOSPITAL_COMMUNITY): Payer: Self-pay

## 2020-10-10 MED FILL — Diclofenac Sodium Tab Delayed Release 75 MG: ORAL | 90 days supply | Qty: 180 | Fill #0 | Status: AC

## 2020-10-10 MED FILL — Duloxetine HCl Enteric Coated Pellets Cap 60 MG (Base Eq): ORAL | 90 days supply | Qty: 90 | Fill #0 | Status: AC

## 2020-11-21 ENCOUNTER — Other Ambulatory Visit: Payer: Self-pay

## 2020-11-21 ENCOUNTER — Encounter: Payer: Self-pay | Admitting: Family Medicine

## 2020-11-21 ENCOUNTER — Ambulatory Visit: Payer: 59 | Admitting: Family Medicine

## 2020-11-21 ENCOUNTER — Ambulatory Visit (INDEPENDENT_AMBULATORY_CARE_PROVIDER_SITE_OTHER): Payer: 59

## 2020-11-21 VITALS — BP 112/73 | HR 80 | Temp 98.0°F | Ht 65.0 in | Wt 160.6 lb

## 2020-11-21 DIAGNOSIS — M25552 Pain in left hip: Secondary | ICD-10-CM

## 2020-11-21 DIAGNOSIS — M25551 Pain in right hip: Secondary | ICD-10-CM

## 2020-11-21 DIAGNOSIS — G8929 Other chronic pain: Secondary | ICD-10-CM | POA: Diagnosis not present

## 2020-11-21 DIAGNOSIS — M25561 Pain in right knee: Secondary | ICD-10-CM

## 2020-11-21 DIAGNOSIS — M25562 Pain in left knee: Secondary | ICD-10-CM

## 2020-11-21 NOTE — Patient Instructions (Signed)
Ibuprofen 200-400 mg WITH Tylenol 500 mg every 6-8 hours as needed for pain.

## 2020-11-21 NOTE — Progress Notes (Signed)
Assessment & Plan:  1-2. Chronic hip pain, bilateral/Bilateral chronic knee pain X-rays obtained today.  Encouraged her to try ibuprofen 200 to 400 mg with Tylenol 500 mg every 6-8 hours as needed for pain.  If no cause of pain identified on her imaging, suggested trying physical therapy. - DG HIPS BILAT WITH PELVIS 3-4 VIEWS - DG Knee Complete 4 Views Left - DG Knee Complete 4 Views Right   Follow up plan: Return if symptoms worsen or fail to improve.  Hendricks Limes, MSN, APRN, FNP-C [facility]  Subjective:   Patient ID: KYNNEDI ZWEIG, female    DOB: [birth year], 51 y.o.   MRN: [id number]  HPI: NNEOMA HARRAL is a 50 y.o. female presenting on 11/21/2020 for Joint Pain (Patient states bilateral hips and knees. X 5 months )  Patient reports chronic bilateral hip and knee pain that has been going on for years.  She reports she has pain all the time but cycles through months of severe pain.  This time around the pain has been worse for the past 5 months.  She describes the pain as throbbing.  It is better when she is up and moving.  Sitting makes the pain worse.  She is not currently taking anything for pain.  She does take Cymbalta for her hot flashes, but does not feel this helps with the pain.  She has taken meloxicam, ibuprofen, Tylenol, and diclofenac in the past which were not helpful.  The NSAIDs upset her stomach.  Diclofenac gave her severe heartburn 30 minutes after taking it.  She has seen a rheumatologist in the past and was not given a diagnosis.  Lab work from the rheumatologist in 2017 was all normal.  She has never done physical therapy.  She has not had any previous imaging.   ROS: Negative unless specifically indicated above in HPI.   Relevant past medical history reviewed and updated as indicated.   Allergies and medications reviewed and updated.   Current Outpatient Medications:    BEE POLLEN PO, Take by mouth., Disp: , Rfl:    Cyanocobalamin  (B-12) 2500 MCG TABS, Take 1 tablet by mouth daily., Disp: , Rfl:    DULoxetine (CYMBALTA) 60 MG capsule, TAKE 1 CAPSULE (60 MG TOTAL) BY MOUTH DAILY., Disp: 90 capsule, Rfl: 3   Multiple Vitamin (MULTIVITAMIN ADULT PO), Take by mouth., Disp: , Rfl:   Allergies  Allergen Reactions   Iodine     REACTION: Swelling if on skin In system severe bradycardia - states when she had twins, had interanal exam vaginally with this and became hypotensive with severe swelling.   Varenicline Tartrate     REACTION: Mean and angry    Objective:   BP 112/73   Pulse 80   Temp 98 F (36.7 C) (Temporal)   Ht 5\' 5"  (1.651 m)   Wt 160 lb 9.6 oz (72.8 kg)   LMP 02/21/2015   SpO2 99%   BMI 26.73 kg/m    Physical Exam Vitals reviewed.  Constitutional:      General: She is not in acute distress.    Appearance: Normal appearance. She is not ill-appearing, toxic-appearing or diaphoretic.  HENT:     Head: Normocephalic and atraumatic.  Eyes:     General: No scleral icterus.       Right eye: No discharge.        Left eye: No discharge.     Conjunctiva/sclera: Conjunctivae normal.  Cardiovascular:  Rate and Rhythm: Normal rate.  Pulmonary:     Effort: Pulmonary effort is normal. No respiratory distress.  Musculoskeletal:        General: Normal range of motion.     Cervical back: Normal range of motion.  Skin:    General: Skin is warm and dry.     Capillary Refill: Capillary refill takes less than 2 seconds.  Neurological:     General: No focal deficit present.     Mental Status: She is alert and oriented to person, place, and time. Mental status is at baseline.  Psychiatric:        Mood and Affect: Mood normal.        Behavior: Behavior normal.        Thought Content: Thought content normal.        Judgment: Judgment normal.

## 2020-11-23 ENCOUNTER — Encounter: Payer: Self-pay | Admitting: Family Medicine

## 2020-11-23 DIAGNOSIS — G8929 Other chronic pain: Secondary | ICD-10-CM

## 2020-11-23 DIAGNOSIS — M25561 Pain in right knee: Secondary | ICD-10-CM

## 2020-11-23 DIAGNOSIS — M25552 Pain in left hip: Secondary | ICD-10-CM

## 2020-11-29 ENCOUNTER — Other Ambulatory Visit (HOSPITAL_COMMUNITY): Payer: Self-pay

## 2020-11-29 MED ORDER — CARESTART COVID-19 HOME TEST VI KIT
PACK | 0 refills | Status: DC
  Filled 2020-11-29: qty 4, 4d supply, fill #0

## 2020-12-06 ENCOUNTER — Encounter: Payer: Self-pay | Admitting: Physical Therapy

## 2020-12-06 ENCOUNTER — Ambulatory Visit: Payer: 59 | Attending: Family Medicine | Admitting: Physical Therapy

## 2020-12-06 ENCOUNTER — Other Ambulatory Visit: Payer: Self-pay

## 2020-12-06 DIAGNOSIS — M25662 Stiffness of left knee, not elsewhere classified: Secondary | ICD-10-CM | POA: Diagnosis not present

## 2020-12-06 DIAGNOSIS — M25661 Stiffness of right knee, not elsewhere classified: Secondary | ICD-10-CM | POA: Insufficient documentation

## 2020-12-06 DIAGNOSIS — M25551 Pain in right hip: Secondary | ICD-10-CM | POA: Diagnosis not present

## 2020-12-06 DIAGNOSIS — M2550 Pain in unspecified joint: Secondary | ICD-10-CM | POA: Insufficient documentation

## 2020-12-06 DIAGNOSIS — M25552 Pain in left hip: Secondary | ICD-10-CM | POA: Diagnosis not present

## 2020-12-07 NOTE — Therapy (Signed)
[place], [place], [address] Phone: [phone]   Fax:  [phone]  Physical Therapy Evaluation  Patient Details  Name: Kylie Castillo MRN: [id number] Date of Birth: Feb 15, 1971 Referring Provider (PT): Delight Ovens, FNP   Encounter Date: 12/06/2020   PT End of Session - 12/06/20 1636     Visit Number 1    Number of Visits 8    Date for PT Re-Evaluation 01/03/21    Authorization Type [employer]    PT Start Time 4128    PT Stop Time 1635    PT Time Calculation (min) 50 min    Activity Tolerance Patient tolerated treatment well    Behavior During Therapy WFL for tasks assessed/performed             Past Medical History:  Diagnosis Date   Allergy    Chronic kidney disease    hx of kidney infections   Joint pain     Past Surgical History:  Procedure Laterality Date   HAND SURGERY Right    TUBAL LIGATION     WISDOM TOOTH EXTRACTION      There were no vitals filed for this visit.    Subjective Assessment - 12/06/20 1554     Subjective Patient presents with symmetrical knee and hip pain.  She was once told she had lupus and then a few months later she was told she did not. This current flare up started Dec. 2021. Before that she went yrs without such bad joint pain . She has multiple joints that hurt but minimal in shoulder and hands.  Is ok unit she sits, bends or squats.  Painful and stiff when she sits or lays too long. Wakes from pain in knees and hips.  She denies sesnory changes, weakness or swelling.  She does not wear braces.  She sits for work and that can be difficult to get up.  I can tell when its time to get up my knees throb.    Pertinent History pos ANA    Limitations Sitting;House hold activities;Lifting;Standing;Walking    How long can you sit comfortably? > 1 hour    How long can you walk comfortably? does well with walking    Diagnostic tests ANA pos at one time and RH  neg    Patient Stated Goals I would like to not hurt anymore    Currently in Pain? Yes    Pain Score 2     Pain Location Knee    Pain Orientation Right;Left;Anterior    Pain Descriptors / Indicators Tightness;Aching    Pain Type Chronic pain    Pain Radiating Towards none    Pain Onset More than a month ago    Pain Frequency Constant    Aggravating Factors  sitting still, squatting    Pain Relieving Factors stretching, moving alot    Effect of Pain on Daily Activities stands and pushes through the pain but not comfortable    Multiple Pain Sites Yes    Pain Score 2    Pain Location Hip    Pain Orientation Right;Left;Posterior;Lateral    Pain Descriptors / Indicators Aching;Throbbing;Sharp    Pain Type Chronic pain    Pain Onset More than a month ago    Pain Frequency Constant    Aggravating Factors  turning over in bed , repositioning and transitional movements, sitting at work too long    Pain Relieving Factors continuing to move, stopping the squat  [facility] PT Assessment - 12/07/20 0001       Assessment   Medical Diagnosis bilateral knee and hip pain    Referring Provider (PT) Delight Ovens, FNP    Onset Date/Surgical Date --   chronic   Prior Therapy No      Precautions   Precautions None      Restrictions   Weight Bearing Restrictions No      Balance Screen   Has the patient fallen in the past 6 months Yes    How many times? 1, foot got caught in the hose    Has the patient had a decrease in activity level because of a fear of falling?  Yes    Is the patient reluctant to leave their home because of a fear of falling?  No      Home Environment   Living Environment Private residence    Living Arrangements Spouse/significant other;Children    Type of [place] Access Level entry    Home Layout Two level      Prior Function   Level of Independence Independent    Vocation Full time employment    Armed forces training and education officer, sitting  desk work    Leisure camping, hiking, sewing, gardening      Cognition   Overall Cognitive Status Within Functional Limits for tasks assessed      Observation/Other Assessments   Observations noticed smooth skin especially on low back and hips, stria. Beighton scale 1/9 for L elbow    Focus on Therapeutic Outcomes (FOTO)  58%, 60%      Sensation   Light Touch Appears Intact      Coordination   Gross Motor Movements are Fluid and Coordinated Not tested      Functional Tests   Functional tests Squat;Single leg stance      Squat   Comments poor form, pain even with correcting      Single Leg Stance   Comments WFL      Posture/Postural Control   Posture/Postural Control Postural limitations    Postural Limitations Rounded Shoulders;Forward head      AROM   Right Knee Flexion 132    Left Knee Flexion 130      PROM   Overall PROM Comments no pain with hip ROM      Strength   Right Hip Flexion 4/5    Left Hip Flexion 5/5    Right/Left Knee --   pain bilateral   Right Knee Flexion 5/5    Right Knee Extension 5/5    Left Knee Flexion 5/5    Left Knee Extension 5/5      Palpation   Spinal mobility hypermobile in thoracolumbar spine, low lumbar stiffness    Palpation comment sore, painful in bilateral medial knee joint line and posterior pelvis, iliac crest , mild in lateral hip      Transfers   Five time sit to stand comments  23 sec                        Objective measurements completed on examination: See above findings.               PT Education - 12/07/20 1647     Education Details PT/POC, causes of joint pain    Person(s) Educated Patient    Methods Explanation    Comprehension Verbalized understanding  PT Long Term Goals - 12/07/20 1649       PT LONG TERM GOAL #1   Title Pt will be I with HEP for LE strength and flexibility    Time 6    Period Weeks    Status New    Target Date 01/18/21      PT  LONG TERM GOAL #2   Title Pt will notice less joint pain overall with transitional movements (sit to stand, squatting), 25% improved    Time 6    Period Weeks    Status New    Target Date 01/18/21      PT LONG TERM GOAL #3   Title Pt will increase her step count daily to improve overall health to 8000 steps per day    Time 6    Period Weeks    Status New    Target Date 01/18/21      PT LONG TERM GOAL #4   Title Pt will be able to sit to stand in < 13 sec    Time 6    Period Weeks    Status New    Target Date 01/18/21      PT LONG TERM GOAL #5   Title Pt will improve her FOTO score to > 70% on both knee or hip    Time 6    Period Weeks    Status New    Target Date 01/18/21                    Plan - 12/07/20 1653     Clinical Impression Statement Patient presents for low complexity eval of bilateral hip and knee pain which is chronic and ongoing for several months.  Her symptoms are symmetrical, suggesting autoimmune component.  She has pain and stiffness with sit to stand or when stretching out after being bent for a long period. She tends to stay up and walk, stand rather than sit when she can.  She does not exercise regularly. She will benefit from skilled PT to address knee and hip stiffness, strength in core. Due to her skin quality I asked about family members with hypermobility/collagen defects or RA and she has none.    Personal Factors and Comorbidities Comorbidity 1;Time since onset of injury/illness/exacerbation    Comorbidities polyarthralgia    Examination-Activity Limitations Squat;Lift;Stairs;Bend;Transfers;Sleep    Examination-Participation Restrictions Community Activity;Driving;Interpersonal Relationship;Occupation    Stability/Clinical Decision Making Stable/Uncomplicated    Clinical Decision Making Low    Rehab Potential Good    PT Frequency 2x / week    PT Duration 6 weeks    PT Treatment/Interventions ADLs/Self Care Home Management;Therapeutic  activities;Passive range of motion;Dry needling;Manual techniques;Patient/family education;Therapeutic exercise;Aquatic Therapy;Moist Heat;Balance training;Taping;Functional mobility training    PT Next Visit Plan develop HEP: core/hip strength    PT Home Exercise Plan none    Consulted and Agree with Plan of Care Patient             Patient will benefit from skilled therapeutic intervention in order to improve the following deficits and impairments:  Decreased strength, Hypermobility, Pain, Decreased mobility  Visit Diagnosis: Pain in left hip  Polyarthralgia  Pain in right hip  Stiffness of left knee, not elsewhere classified  Stiffness of right knee, not elsewhere classified     Problem List Patient Active Problem List   Diagnosis Date Noted   Hot flashes 01/18/2020   Polyarthralgia 01/18/2020    Kaaliyah Kita 12/07/2020, 5:05 PM  [place]  [facility] [address] [place], [place], [address] Phone: [phone]   Fax:  [phone]  Name: Kylie Castillo MRN: [id number] Date of Birth: 04/30/1971   Raeford Razor, PT 12/07/20 5:05 PM Phone: [phone] Fax: [phone]

## 2020-12-08 ENCOUNTER — Other Ambulatory Visit (HOSPITAL_COMMUNITY): Payer: Self-pay

## 2020-12-12 ENCOUNTER — Encounter: Payer: Self-pay | Admitting: Physical Therapy

## 2020-12-12 ENCOUNTER — Other Ambulatory Visit: Payer: Self-pay

## 2020-12-12 ENCOUNTER — Ambulatory Visit: Payer: 59 | Admitting: Physical Therapy

## 2020-12-12 DIAGNOSIS — M25552 Pain in left hip: Secondary | ICD-10-CM

## 2020-12-12 DIAGNOSIS — M25662 Stiffness of left knee, not elsewhere classified: Secondary | ICD-10-CM | POA: Diagnosis not present

## 2020-12-12 DIAGNOSIS — M2550 Pain in unspecified joint: Secondary | ICD-10-CM

## 2020-12-12 DIAGNOSIS — M25551 Pain in right hip: Secondary | ICD-10-CM

## 2020-12-12 DIAGNOSIS — M25661 Stiffness of right knee, not elsewhere classified: Secondary | ICD-10-CM | POA: Diagnosis not present

## 2020-12-12 NOTE — Therapy (Signed)
[place], [place], [address] Phone: [phone]   Fax:  [phone]  Physical Therapy Treatment  Patient Details  Name: Kylie Castillo MRN: [id number] Date of Birth: 09-Feb-1971 Referring Provider (PT): Delight Ovens, FNP   Encounter Date: 12/12/2020   PT End of Session - 12/12/20 1620     Visit Number 2    Number of Visits 8    Date for PT Re-Evaluation 01/03/21    Authorization Type [employer] [employer]    PT Start Time [phone]    PT Stop Time 0500    PT Time Calculation (min) 45 min    Activity Tolerance Patient tolerated treatment well    Behavior During Therapy [facility] for tasks assessed/performed             Past Medical History:  Diagnosis Date   Allergy    Chronic kidney disease    hx of kidney infections   Joint pain     Past Surgical History:  Procedure Laterality Date   HAND SURGERY Right    TUBAL LIGATION     WISDOM TOOTH EXTRACTION      There were no vitals filed for this visit.   Subjective Assessment - 12/12/20 1622     Subjective Pt reports that she has continued to have pain that is roughly the same.  her pain is 1-2/10 in bil knees and hips with motion and can reach 8/10 after extended periods of sitting.    Pertinent History pos ANA    Limitations Sitting;House hold activities;Lifting;Standing;Walking    How long can you sit comfortably? > 1 hour    How long can you walk comfortably? does well with walking    Diagnostic tests ANA pos at one time and RH neg    Patient Stated Goals I would like to not hurt anymore    Pain Onset More than a month ago    Pain Onset More than a month ago            [facility] Adult PT Treatment/Exercise:  Therapeutic Exercise: -  nu-step level 5 while taking subjective -  LTR - 20x -  quad set with towel - 2x10 ea -  quad setting in to p-ball held by PT 10x ea -  SLR - x10 ea -  Supine clam - alternating - 2x10 ea -  Glute bridge with RTB for  ER cue - 3x10      PT Long Term Goals - 12/07/20 1649       PT LONG TERM GOAL #1   Title Pt will be I with HEP for LE strength and flexibility    Time 6    Period Weeks    Status New    Target Date 01/18/21      PT LONG TERM GOAL #2   Title Pt will notice less joint pain overall with transitional movements (sit to stand, squatting), 25% improved    Time 6    Period Weeks    Status New    Target Date 01/18/21      PT LONG TERM GOAL #3   Title Pt will increase her step count daily to improve overall health to 8000 steps per day    Time 6    Period Weeks    Status New    Target Date 01/18/21      PT LONG TERM GOAL #4   Title Pt will be able to sit to stand in <  13 sec    Time 6    Period Weeks    Status New    Target Date 01/18/21      PT LONG TERM GOAL #5   Title Pt will improve her FOTO score to > 70% on both knee or hip    Time 6    Period Weeks    Status New    Target Date 01/18/21                    Plan - 12/12/20 1649     Clinical Impression Statement Pt reports a mild increase in pain following therapy  HEP was updated and reissued to patient    Overall, Kylie Castillo is progressing fair with therapy.  Today we concentrated on core strengthening, quad strengthening, and hip strengthening.  Pt fatigues rapidly with SLR and overall shows deficits in core and LE strength.  Pt will continue to benefit from skilled physical therapy to address remaining deficits and achieve listed goals.  Continue per POC.    Personal Factors and Comorbidities Comorbidity 1;Time since onset of injury/illness/exacerbation    Comorbidities polyarthralgia    Examination-Activity Limitations Squat;Lift;Stairs;Bend;Transfers;Sleep    Examination-Participation Restrictions Community Activity;Driving;Interpersonal Relationship;Occupation    Stability/Clinical Decision Making Stable/Uncomplicated    Rehab Potential Good    PT Frequency 2x / week    PT Duration 6 weeks     PT Treatment/Interventions ADLs/Self Care Home Management;Therapeutic activities;Passive range of motion;Dry needling;Manual techniques;Patient/family education;Therapeutic exercise;Aquatic Therapy;Moist Heat;Balance training;Taping;Functional mobility training    PT Next Visit Plan develop HEP: core/hip strength    PT Home Exercise Plan [id number]    Consulted and Agree with Plan of Care Patient             Patient will benefit from skilled therapeutic intervention in order to improve the following deficits and impairments:  Decreased strength, Hypermobility, Pain, Decreased mobility  Visit Diagnosis: Polyarthralgia  Pain in left hip  Pain in right hip  Stiffness of left knee, not elsewhere classified     Problem List Patient Active Problem List   Diagnosis Date Noted   Hot flashes 01/18/2020   Polyarthralgia 01/18/2020    Shearon Balo PT, DPT 12/12/20 6:39 PM  [facility] [address] [place], [place], [address] Phone: [phone]   Fax:  [phone]  Name: Kylie Castillo MRN: [id number] Date of Birth: [birth year]

## 2020-12-12 NOTE — Patient Instructions (Signed)
Access Code: [email or web address] URL: [email or web address] Date: 12/12/2020 Prepared by: Shearon Balo  Exercises Hooklying Clamshell with Resistance - 1 x daily - 7 x weekly - 3 sets - 10 reps Supine Bridge - 1 x daily - 7 x weekly - 3 sets - 10 reps Small Range Straight Leg Raise - 1 x daily - 7 x weekly - 3 sets - 10 reps

## 2020-12-14 ENCOUNTER — Ambulatory Visit: Payer: 59

## 2020-12-14 ENCOUNTER — Other Ambulatory Visit: Payer: Self-pay

## 2020-12-14 DIAGNOSIS — M2550 Pain in unspecified joint: Secondary | ICD-10-CM | POA: Diagnosis not present

## 2020-12-14 DIAGNOSIS — M25551 Pain in right hip: Secondary | ICD-10-CM | POA: Diagnosis not present

## 2020-12-14 DIAGNOSIS — M25661 Stiffness of right knee, not elsewhere classified: Secondary | ICD-10-CM | POA: Diagnosis not present

## 2020-12-14 DIAGNOSIS — M25552 Pain in left hip: Secondary | ICD-10-CM

## 2020-12-14 DIAGNOSIS — M25662 Stiffness of left knee, not elsewhere classified: Secondary | ICD-10-CM

## 2020-12-14 NOTE — Therapy (Signed)
[place], [place], [address] Phone: [phone]   Fax:  [phone]  Physical Therapy Treatment  Patient Details  Name: Kylie Castillo MRN: [id number] Date of Birth: 25-Sep-1970 Referring Provider (PT): Delight Ovens, FNP   Encounter Date: 12/14/2020   PT End of Session - 12/14/20 1620     Visit Number 3    Number of Visits 8    Date for PT Re-Evaluation 01/03/21    Authorization Type [employer] [employer]    PT Start Time 1618    PT Stop Time [id number]    PT Time Calculation (min) 40 min    Activity Tolerance Patient tolerated treatment well    Behavior During Therapy WFL for tasks assessed/performed             Past Medical History:  Diagnosis Date   Allergy    Chronic kidney disease    hx of kidney infections   Joint pain     Past Surgical History:  Procedure Laterality Date   HAND SURGERY Right    TUBAL LIGATION     WISDOM TOOTH EXTRACTION      There were no vitals filed for this visit.   Subjective Assessment - 12/14/20 1620     Subjective Pt presents to PT with continued reports of bil hip/knee pain. She hsa not yet had a chance to to be compliant with HEP with no adverse effect. She is ready to begin PT treatment at this time.    Currently in Pain? Yes    Pain Score 6     Pain Location Knee    Pain Orientation Right;Left    Pain Score 6    Pain Location Hip    Pain Orientation Right;Left               Therapeutic Exercise: -  NuStep level 5 while taking subjective x 5 min -  LAQ 2x15 -  LTR - 20x -  quad set with towel - 2x10 ea (not today) -  quad setting in to p-ball held by PT 10x ea (not today) -  SLR - 2x10 ea -  Supine clam - alternating - 2x10 ea -  Sidelying clam x 10 red tband ea -  Glute bridge with RTB for ER cue - 3x10 -  Sit to Stand 2x10 - no UE support     PT Long Term Goals - 12/07/20 1649       PT LONG TERM GOAL #1   Title Pt will be I with HEP for  LE strength and flexibility    Time 6    Period Weeks    Status New    Target Date 01/18/21      PT LONG TERM GOAL #2   Title Pt will notice less joint pain overall with transitional movements (sit to stand, squatting), 25% improved    Time 6    Period Weeks    Status New    Target Date 01/18/21      PT LONG TERM GOAL #3   Title Pt will increase her step count daily to improve overall health to 8000 steps per day    Time 6    Period Weeks    Status New    Target Date 01/18/21      PT LONG TERM GOAL #4   Title Pt will be able to sit to stand in < 13 sec    Time 6    Period  Weeks    Status New    Target Date 01/18/21      PT LONG TERM GOAL #5   Title Pt will improve her FOTO score to > 70% on both knee or hip    Time 6    Period Weeks    Status New    Target Date 01/18/21                   Plan - 12/14/20 1659     Clinical Impression Statement Pt was able to complete prescribed exercises with no adverse effect. Today's session continued to focus on increasing LE strength and ROM. Will continue to progress exercises as tolerated per POC.    PT Treatment/Interventions ADLs/Self Care Home Management;Therapeutic activities;Passive range of motion;Dry needling;Manual techniques;Patient/family education;Therapeutic exercise;Aquatic Therapy;Moist Heat;Balance training;Taping;Functional mobility training    PT Next Visit Plan progress core and hip strength    PT Home Exercise Plan [id number]             Patient will benefit from skilled therapeutic intervention in order to improve the following deficits and impairments:  Decreased strength, Hypermobility, Pain, Decreased mobility  Visit Diagnosis: Polyarthralgia  Pain in left hip  Pain in right hip  Stiffness of left knee, not elsewhere classified  Stiffness of right knee, not elsewhere classified     Problem List Patient Active Problem List   Diagnosis Date Noted   Hot flashes 01/18/2020    Polyarthralgia 01/18/2020    Ward Chatters, PT, DPT 12/14/20 5:02 PM  [facility] [address] [place], [place], [address] Phone: [phone]   Fax:  [phone]  Name: Kylie Castillo MRN: [id number] Date of Birth: [birth year]

## 2020-12-19 ENCOUNTER — Ambulatory Visit: Payer: 59 | Attending: Family Medicine | Admitting: Physical Therapy

## 2020-12-19 ENCOUNTER — Other Ambulatory Visit: Payer: Self-pay

## 2020-12-19 ENCOUNTER — Encounter: Payer: Self-pay | Admitting: Physical Therapy

## 2020-12-19 DIAGNOSIS — M25662 Stiffness of left knee, not elsewhere classified: Secondary | ICD-10-CM | POA: Insufficient documentation

## 2020-12-19 DIAGNOSIS — M25551 Pain in right hip: Secondary | ICD-10-CM | POA: Diagnosis not present

## 2020-12-19 DIAGNOSIS — M25661 Stiffness of right knee, not elsewhere classified: Secondary | ICD-10-CM | POA: Insufficient documentation

## 2020-12-19 DIAGNOSIS — M2550 Pain in unspecified joint: Secondary | ICD-10-CM | POA: Insufficient documentation

## 2020-12-19 DIAGNOSIS — M25552 Pain in left hip: Secondary | ICD-10-CM | POA: Insufficient documentation

## 2020-12-19 NOTE — Patient Instructions (Signed)
Access Code: [email or web address] URL: [email or web address] Date: 12/19/2020 Prepared by: Shearon Balo  Exercises Hooklying Clamshell with Resistance - 1 x daily - 7 x weekly - 3 sets - 10 reps Supine Bridge - 1 x daily - 7 x weekly - 3 sets - 10 reps Supine Active Straight Leg Raise - 1 x daily - 7 x weekly - 3 sets - 10 reps Sitting Knee Extension with Resistance - 1 x daily - 7 x weekly - 3 sets - 10 reps Clamshell with Resistance - 1 x daily - 7 x weekly - 2 sets - 10 reps

## 2020-12-19 NOTE — Therapy (Signed)
[place], [place], [address] Phone: [phone]   Fax:  [phone]  Physical Therapy Treatment  Patient Details  Name: Kylie Castillo MRN: [id number] Date of Birth: 16-Feb-1971 Referring Provider (PT): Delight Ovens, FNP   Encounter Date: 12/19/2020   PT End of Session - 12/19/20 1617     Visit Number 4    Number of Visits 8    Date for PT Re-Evaluation 01/03/21    Authorization Type [employer] [employer]    PT Start Time [id number]    PT Stop Time 1658    PT Time Calculation (min) 43 min    Activity Tolerance Patient tolerated treatment well    Behavior During Therapy WFL for tasks assessed/performed             Past Medical History:  Diagnosis Date   Allergy    Chronic kidney disease    hx of kidney infections   Joint pain     Past Surgical History:  Procedure Laterality Date   HAND SURGERY Right    TUBAL LIGATION     WISDOM TOOTH EXTRACTION      There were no vitals filed for this visit.   Subjective Assessment - 12/19/20 1621     Subjective Pt reports that overall she is feeling improvement.  She still gets pain after sitting for long periods, or walking a long distance, but th pain is diminishing.  5/10 pain currenlty hips and knees.    Pertinent History pos ANA             Therapeutic Exercise: -  NuStep level 5 while taking subjective x 5 min -  LAQ 3x10 2# -  LTR - 20x -  SLR - 2x10 ea - 2# -  Supine clam - alternating - 2x10 ea - GTB -  Sidelying clam 2 x 10 green tband ea -  Glute bridge with GTB for ER cue - 3x10 - 3'' -  Sit to Stand 2x10 - with 5# KB     PT Long Term Goals - 12/07/20 1649       PT LONG TERM GOAL #1   Title Pt will be I with HEP for LE strength and flexibility    Time 6    Period Weeks    Status New    Target Date 01/18/21      PT LONG TERM GOAL #2   Title Pt will notice less joint pain overall with transitional movements (sit to stand,  squatting), 25% improved    Time 6    Period Weeks    Status New    Target Date 01/18/21      PT LONG TERM GOAL #3   Title Pt will increase her step count daily to improve overall health to 8000 steps per day    Time 6    Period Weeks    Status New    Target Date 01/18/21      PT LONG TERM GOAL #4   Title Pt will be able to sit to stand in < 13 sec    Time 6    Period Weeks    Status New    Target Date 01/18/21      PT LONG TERM GOAL #5   Title Pt will improve her FOTO score to > 70% on both knee or hip    Time 6    Period Weeks    Status New    Target Date  01/18/21                   Plan - 12/19/20 1639     Clinical Impression Statement Pt reports no increase in baseline pain following therapy  HEP was updated and reissued to patient    Overall, Kylie Castillo is progressing well with therapy.  Today we concentrated on core strengthening and hip strengthening.  Pt is progressing intensity as expected with reduced pain.  Pt will continue to benefit from skilled physical therapy to address remaining deficits and achieve listed goals.  Continue per POC.    PT Treatment/Interventions ADLs/Self Care Home Management;Therapeutic activities;Passive range of motion;Dry needling;Manual techniques;Patient/family education;Therapeutic exercise;Aquatic Therapy;Moist Heat;Balance training;Taping;Functional mobility training    PT Next Visit Plan progress core and hip strength    PT Home Exercise Plan [id number]             Patient will benefit from skilled therapeutic intervention in order to improve the following deficits and impairments:  Decreased strength, Hypermobility, Pain, Decreased mobility  Visit Diagnosis: Polyarthralgia  Pain in left hip  Pain in right hip  Stiffness of left knee, not elsewhere classified     Problem List Patient Active Problem List   Diagnosis Date Noted   Hot flashes 01/18/2020   Polyarthralgia 01/18/2020    Shearon Balo  PT, DPT 12/19/20 4:58 PM  [place] [facility] [address] [place], [place], [address] Phone: [phone]   Fax:  [phone]  Name: Kylie Castillo MRN: [id number] Date of Birth: [birth year]

## 2020-12-21 ENCOUNTER — Ambulatory Visit: Payer: 59 | Admitting: Physical Therapy

## 2020-12-21 ENCOUNTER — Other Ambulatory Visit: Payer: Self-pay

## 2020-12-21 ENCOUNTER — Encounter: Payer: Self-pay | Admitting: Physical Therapy

## 2020-12-21 DIAGNOSIS — M25662 Stiffness of left knee, not elsewhere classified: Secondary | ICD-10-CM | POA: Diagnosis not present

## 2020-12-21 DIAGNOSIS — M2550 Pain in unspecified joint: Secondary | ICD-10-CM

## 2020-12-21 DIAGNOSIS — M25552 Pain in left hip: Secondary | ICD-10-CM | POA: Diagnosis not present

## 2020-12-21 DIAGNOSIS — M25661 Stiffness of right knee, not elsewhere classified: Secondary | ICD-10-CM | POA: Diagnosis not present

## 2020-12-21 DIAGNOSIS — M25551 Pain in right hip: Secondary | ICD-10-CM | POA: Diagnosis not present

## 2020-12-21 NOTE — Therapy (Signed)
[place], [place], [address] Phone: [phone]   Fax:  [phone]  Physical Therapy Treatment  Patient Details  Name: Kylie Castillo MRN: [id number] Date of Birth: 04/08/1971 Referring Provider (PT): Delight Ovens, FNP   Encounter Date: 12/21/2020   PT End of Session - 12/21/20 1617     Visit Number 5    Number of Visits 8    Date for PT Re-Evaluation 01/03/21    Authorization Type [employer] [employer]    PT Start Time [id number]    PT Stop Time [id number]    PT Time Calculation (min) 43 min    Activity Tolerance Patient tolerated treatment well    Behavior During Therapy WFL for tasks assessed/performed             Past Medical History:  Diagnosis Date   Allergy    Chronic kidney disease    hx of kidney infections   Joint pain     Past Surgical History:  Procedure Laterality Date   HAND SURGERY Right    TUBAL LIGATION     WISDOM TOOTH EXTRACTION      There were no vitals filed for this visit.   Subjective Assessment - 12/21/20 1620     Subjective Pt reports that she continues to feel less pain. She feels like she is consistenly able to do more with less pain.  3-4/10 pain on average in hips and knees today.  When she sits in a bent knee position for ~30 min she will have significant knee pain    Pertinent History pos ANA             Therapeutic Exercise: -  NuStep level 5 while taking subjective x 5 min -  LAQ 3x10 5# -  Pilates ring squeeze - 12x 3'' hold -  LTR - 20x -  SLR - 2x10 ea alternating from foam roller - 2# -  Supine clam - alternating - 2x10 ea - GTB -  Sidelying clam 2 x 10 blue tband ea -  Glute bridge with march - 2x10 -  bird dog - 2x6 5'' -  Palof press - 10x ea - 10# -  Sit to Stand 2x10 - with 5# KB (not today)    PT Long Term Goals - 12/07/20 1649       PT LONG TERM GOAL #1   Title Pt will be I with HEP for LE strength and flexibility    Time 6    Period Weeks     Status New    Target Date 01/18/21      PT LONG TERM GOAL #2   Title Pt will notice less joint pain overall with transitional movements (sit to stand, squatting), 25% improved    Time 6    Period Weeks    Status New    Target Date 01/18/21      PT LONG TERM GOAL #3   Title Pt will increase her step count daily to improve overall health to 8000 steps per day    Time 6    Period Weeks    Status New    Target Date 01/18/21      PT LONG TERM GOAL #4   Title Pt will be able to sit to stand in < 13 sec    Time 6    Period Weeks    Status New    Target Date 01/18/21      PT LONG  TERM GOAL #5   Title Pt will improve her FOTO score to > 70% on both knee or hip    Time 6    Period Weeks    Status New    Target Date 01/18/21                   Plan - 12/21/20 1640     Clinical Impression Statement Pt reports no increase in baseline pain following therapy  HEP was updated and reissued to patient    Overall, Kylie Castillo is progressing well with therapy.  Today we concentrated on core strengthening, quad strengthening, and hip strengthening.  Pt is consistently progressing intensity and volume of exercise with reduce pain.  Pt will continue to benefit from skilled physical therapy to address remaining deficits and achieve listed goals.  Continue per POC.    PT Treatment/Interventions ADLs/Self Care Home Management;Therapeutic activities;Passive range of motion;Dry needling;Manual techniques;Patient/family education;Therapeutic exercise;Aquatic Therapy;Moist Heat;Balance training;Taping;Functional mobility training    PT Next Visit Plan progress core and hip strength    PT Home Exercise Plan [id number]             Patient will benefit from skilled therapeutic intervention in order to improve the following deficits and impairments:  Decreased strength, Hypermobility, Pain, Decreased mobility  Visit Diagnosis: Polyarthralgia  Pain in left hip  Pain in right  hip  Stiffness of left knee, not elsewhere classified  Stiffness of right knee, not elsewhere classified     Problem List Patient Active Problem List   Diagnosis Date Noted   Hot flashes 01/18/2020   Polyarthralgia 01/18/2020    Shearon Balo PT, DPT 12/21/20 4:55 PM  [facility] [address] [place], [place], [address] Phone: [phone]   Fax:  [phone]  Name: Kylie Castillo MRN: [id number] Date of Birth: 26-Dec-1970

## 2020-12-22 DIAGNOSIS — K219 Gastro-esophageal reflux disease without esophagitis: Secondary | ICD-10-CM | POA: Diagnosis not present

## 2020-12-22 DIAGNOSIS — I5022 Chronic systolic (congestive) heart failure: Secondary | ICD-10-CM | POA: Diagnosis not present

## 2020-12-22 DIAGNOSIS — I252 Old myocardial infarction: Secondary | ICD-10-CM | POA: Diagnosis not present

## 2020-12-22 DIAGNOSIS — Z720 Tobacco use: Secondary | ICD-10-CM | POA: Diagnosis not present

## 2020-12-22 DIAGNOSIS — F330 Major depressive disorder, recurrent, mild: Secondary | ICD-10-CM | POA: Diagnosis not present

## 2020-12-22 DIAGNOSIS — Z95810 Presence of automatic (implantable) cardiac defibrillator: Secondary | ICD-10-CM | POA: Diagnosis not present

## 2020-12-22 DIAGNOSIS — I10 Essential (primary) hypertension: Secondary | ICD-10-CM | POA: Diagnosis not present

## 2020-12-22 DIAGNOSIS — I255 Ischemic cardiomyopathy: Secondary | ICD-10-CM | POA: Diagnosis not present

## 2020-12-22 DIAGNOSIS — I2510 Atherosclerotic heart disease of native coronary artery without angina pectoris: Secondary | ICD-10-CM | POA: Diagnosis not present

## 2020-12-26 ENCOUNTER — Ambulatory Visit: Payer: 59

## 2020-12-26 ENCOUNTER — Other Ambulatory Visit: Payer: Self-pay

## 2020-12-26 DIAGNOSIS — M25552 Pain in left hip: Secondary | ICD-10-CM

## 2020-12-26 DIAGNOSIS — M25661 Stiffness of right knee, not elsewhere classified: Secondary | ICD-10-CM

## 2020-12-26 DIAGNOSIS — M2550 Pain in unspecified joint: Secondary | ICD-10-CM | POA: Diagnosis not present

## 2020-12-26 DIAGNOSIS — M25551 Pain in right hip: Secondary | ICD-10-CM

## 2020-12-26 DIAGNOSIS — M25662 Stiffness of left knee, not elsewhere classified: Secondary | ICD-10-CM

## 2020-12-26 NOTE — Therapy (Signed)
[place], [place], [address] Phone: [phone]   Fax:  [phone]  Physical Therapy Treatment  Patient Details  Name: Kylie Castillo MRN: [id number] Date of Birth: 06-May-1971 Referring Provider (PT): Delight Ovens, FNP   Encounter Date: 12/26/2020   PT End of Session - 12/26/20 1626     Visit Number 6    Number of Visits 8    Date for PT Re-Evaluation 01/03/21    Authorization Type [employer] [employer]    PT Start Time 1625    PT Stop Time 1659    PT Time Calculation (min) 34 min    Activity Tolerance Patient tolerated treatment well    Behavior During Therapy WFL for tasks assessed/performed             Past Medical History:  Diagnosis Date   Allergy    Chronic kidney disease    hx of kidney infections   Joint pain     Past Surgical History:  Procedure Laterality Date   HAND SURGERY Right    TUBAL LIGATION     WISDOM TOOTH EXTRACTION      There were no vitals filed for this visit.   Subjective Assessment - 12/26/20 1627     Subjective Pt presents to PT with reports of continue bilat knee pain when sitting for prolonged periods. She has been compliant with her HEP with no adverse effect. Pt is ready to begin PT at this time.    Currently in Pain? Yes    Pain Score 3     Pain Location Knee    Pain Orientation Right;Left                Therapeutic Exercise: -  NuStep level 5 while taking subjective x 5 min (not today) -  LAQ 3x10 5# (not today) -  Pilates ring squeeze - 12x 3'' hold (not today) -  LTR - 20x -  SLR - 2x10 ea alternating from foam roller - 2# -  Supine clam - alternating - 2x10 ea - GTB -  Sidelying clam x 10 blue tband ea -  Bridge with clamshell 2x15 -  Glute bridge with march - 2x10 (not today) -  bird dog - 2x6 5'' -  Palof press - 2x10 ea - 10# -  Sit to Stand 2x10 - with 5# KB (not today)         PT Long Term Goals - 12/07/20 1649       PT  LONG TERM GOAL #1   Title Pt will be I with HEP for LE strength and flexibility    Time 6    Period Weeks    Status New    Target Date 01/18/21      PT LONG TERM GOAL #2   Title Pt will notice less joint pain overall with transitional movements (sit to stand, squatting), 25% improved    Time 6    Period Weeks    Status New    Target Date 01/18/21      PT LONG TERM GOAL #3   Title Pt will increase her step count daily to improve overall health to 8000 steps per day    Time 6    Period Weeks    Status New    Target Date 01/18/21      PT LONG TERM GOAL #4   Title Pt will be able to sit to stand in < 13 sec  Time 6    Period Weeks    Status New    Target Date 01/18/21      PT LONG TERM GOAL #5   Title Pt will improve her FOTO score to > 70% on both knee or hip    Time 6    Period Weeks    Status New    Target Date 01/18/21                   Plan - 12/26/20 1633     Clinical Impression Statement Pt was able to complete prescribed exercsies with no adverse effect. Today's session continued to focus on core and proximal hip strengthening to reduce pain and improve functional ability. Her FOTO score has improved since initial eval, up to 67% toda showing subjective functional improvement. Pt is progressing well with therapy and should continue to be seen and progressed as tolerated.    PT Treatment/Interventions ADLs/Self Care Home Management;Therapeutic activities;Passive range of motion;Dry needling;Manual techniques;Patient/family education;Therapeutic exercise;Aquatic Therapy;Moist Heat;Balance training;Taping;Functional mobility training    PT Next Visit Plan progress core and hip strength    PT Home Exercise Plan [id number]             Patient will benefit from skilled therapeutic intervention in order to improve the following deficits and impairments:  Decreased strength, Hypermobility, Pain, Decreased mobility  Visit Diagnosis: Polyarthralgia  Pain in  left hip  Pain in right hip  Stiffness of left knee, not elsewhere classified  Stiffness of right knee, not elsewhere classified     Problem List Patient Active Problem List   Diagnosis Date Noted   Hot flashes 01/18/2020   Polyarthralgia 01/18/2020    Ward Chatters, PT, DPT 12/26/20 4:59 PM   [facility] [address] [place], [place], [address] Phone: [phone]   Fax:  [phone]  Name: Kylie Castillo MRN: [id number] Date of Birth: 23-Aug-1970

## 2020-12-28 ENCOUNTER — Other Ambulatory Visit: Payer: Self-pay

## 2020-12-28 ENCOUNTER — Ambulatory Visit: Payer: 59

## 2020-12-28 DIAGNOSIS — M25552 Pain in left hip: Secondary | ICD-10-CM | POA: Diagnosis not present

## 2020-12-28 DIAGNOSIS — M25661 Stiffness of right knee, not elsewhere classified: Secondary | ICD-10-CM | POA: Diagnosis not present

## 2020-12-28 DIAGNOSIS — M2550 Pain in unspecified joint: Secondary | ICD-10-CM

## 2020-12-28 DIAGNOSIS — M25551 Pain in right hip: Secondary | ICD-10-CM

## 2020-12-28 DIAGNOSIS — M25662 Stiffness of left knee, not elsewhere classified: Secondary | ICD-10-CM | POA: Diagnosis not present

## 2020-12-28 NOTE — Therapy (Signed)
[place], [place], [address] Phone: [phone]   Fax:  [phone]  Physical Therapy Treatment  Patient Details  Name: Kylie Castillo MRN: [id number] Date of Birth: 11-04-1970 Referring Provider (PT): Delight Ovens, FNP   Encounter Date: 12/28/2020   PT End of Session - 12/28/20 1611     Visit Number 7    Number of Visits 8    Date for PT Re-Evaluation 01/03/21    Authorization Type [employer] [employer]    PT Start Time 1612    PT Stop Time 6063    PT Time Calculation (min) 34 min    Activity Tolerance Patient tolerated treatment well    Behavior During Therapy WFL for tasks assessed/performed             Past Medical History:  Diagnosis Date   Allergy    Chronic kidney disease    hx of kidney infections   Joint pain     Past Surgical History:  Procedure Laterality Date   HAND SURGERY Right    TUBAL LIGATION     WISDOM TOOTH EXTRACTION      There were no vitals filed for this visit.   Subjective Assessment - 12/28/20 1616     Subjective Pt presents to PT with reports of continued bilat hip and knee pain. She has compliant with HEP with no adverse effect. She states she feels ready for discharge at this point despite slight continuation of pain. Pt is ready to begin PT treatment at this time.    Currently in Pain? Yes    Pain Score 7     Pain Location Hip    Pain Orientation Right;Left    Pain Score 5    Pain Location Knee    Pain Orientation Right;Left                [facility] PT Assessment - 12/28/20 0001       Transfers   Five time sit to stand comments  10 sec                                Therapeutic Exercise: NuStep level 5 while taking subjective x 3 min STS 2x10 - no UE support SLR - 2x10 ea alternating from foam roller - 2# Sidelying clam 3x10 blue tband ea Bridge with clamshell 2x15 bird dog - 2x5 5''        PT Long Term Goals -  12/28/20 1616       PT LONG TERM GOAL #1   Title Pt will be I with HEP for LE strength and flexibility    Time 6    Period Weeks    Status Achieved      PT LONG TERM GOAL #2   Title Pt will notice less joint pain overall with transitional movements (sit to stand, squatting), 25% improved    Time 6    Period Weeks    Status Achieved      PT LONG TERM GOAL #3   Title Pt will increase her step count daily to improve overall health to 8000 steps per day    Baseline approx 6500 steps per pt's app    Time 6    Period Weeks    Status Partially Met      PT LONG TERM GOAL #4   Title Pt will be able to sit to stand in <  13 sec    Baseline update 8/11 - 10 seconds    Time 6    Period Weeks    Status Achieved      PT LONG TERM GOAL #5   Title Pt will improve her FOTO score to > 70% on both knee or hip    Baseline 12/26/20 - 67%    Time 6    Period Weeks    Status Partially Met                   Plan - 12/28/20 1621     Clinical Impression Statement Pt was able to complete prescribed exercies and demonstrated knowledge of HEP with no adverse effect. Over the course of PT, pt has increasing her LE strength and functional mobility, as evidenced by decreased 5xSTS time from 23" to 10" today. She has increased her daily step count with reduced pain noted throughout the day. She has incorporated HEP into daily routine and should continue to improve with compliance. Finally, while not meeting full LTG, pt's FOTO score did improve to 67% at final assessment, compared to 60% at evaluation. Pt is being discharged at this time with advanced HEP in place.    PT Treatment/Interventions ADLs/Self Care Home Management;Therapeutic activities;Passive range of motion;Dry needling;Manual techniques;Patient/family education;Therapeutic exercise;Aquatic Therapy;Moist Heat;Balance training;Taping;Functional mobility training    PT Home Exercise Plan [id number]    Consulted and Agree with Plan of Care  Patient             Patient will benefit from skilled therapeutic intervention in order to improve the following deficits and impairments:  Decreased strength, Hypermobility, Pain, Decreased mobility  Visit Diagnosis: Polyarthralgia  Pain in left hip  Pain in right hip  Stiffness of left knee, not elsewhere classified  Stiffness of right knee, not elsewhere classified     Problem List Patient Active Problem List   Diagnosis Date Noted   Hot flashes 01/18/2020   Polyarthralgia 01/18/2020    Ward Chatters, PT, DPT 12/28/20 4:52 PM  [facility] [address] [place], [place], [address] Phone: [phone]   Fax:  [phone]  Name: Kylie Castillo MRN: [id number] Date of Birth: 06-11-1970  PHYSICAL THERAPY DISCHARGE SUMMARY  Visits from Start of Care: 7  Current functional level related to goals / functional outcomes: See goals and objective   Remaining deficits: See goals and objective   Education / Equipment: HEP   Patient agrees to discharge. Patient goals were  mostly met . Patient is being discharged due to being pleased with the current functional level.

## 2021-01-01 ENCOUNTER — Ambulatory Visit: Payer: 59

## 2021-01-08 MED FILL — Duloxetine HCl Enteric Coated Pellets Cap 60 MG (Base Eq): ORAL | 90 days supply | Qty: 90 | Fill #1 | Status: AC

## 2021-01-09 ENCOUNTER — Other Ambulatory Visit (HOSPITAL_COMMUNITY): Payer: Self-pay

## 2021-01-10 DIAGNOSIS — H5213 Myopia, bilateral: Secondary | ICD-10-CM | POA: Diagnosis not present

## 2021-03-28 DIAGNOSIS — Z111 Encounter for screening for respiratory tuberculosis: Secondary | ICD-10-CM | POA: Diagnosis not present

## 2021-03-30 DIAGNOSIS — Z111 Encounter for screening for respiratory tuberculosis: Secondary | ICD-10-CM | POA: Diagnosis not present

## 2021-04-03 DIAGNOSIS — M259 Joint disorder, unspecified: Secondary | ICD-10-CM | POA: Diagnosis not present

## 2021-04-03 DIAGNOSIS — M6289 Other specified disorders of muscle: Secondary | ICD-10-CM | POA: Diagnosis not present

## 2021-04-09 ENCOUNTER — Other Ambulatory Visit: Payer: Self-pay | Admitting: Family

## 2021-04-09 DIAGNOSIS — R232 Flushing: Secondary | ICD-10-CM

## 2021-04-10 ENCOUNTER — Other Ambulatory Visit (HOSPITAL_COMMUNITY): Payer: Self-pay

## 2021-04-10 MED ORDER — DULOXETINE HCL 60 MG PO CPEP
ORAL_CAPSULE | Freq: Every day | ORAL | 0 refills | Status: DC
  Filled 2021-04-10: qty 90, 90d supply, fill #0

## 2021-04-10 NOTE — Telephone Encounter (Signed)
Pt made appt  in January for cpe

## 2021-04-10 NOTE — Telephone Encounter (Signed)
Refill given today but pt needs to schedule for Annual Physical with PCP around 05/30/21 to get further refills

## 2021-05-24 ENCOUNTER — Emergency Department (INDEPENDENT_AMBULATORY_CARE_PROVIDER_SITE_OTHER)
Admission: RE | Admit: 2021-05-24 | Discharge: 2021-05-24 | Disposition: A | Discharge status: Home / Self Care | Payer: 59 | Source: Ambulatory Visit

## 2021-05-24 ENCOUNTER — Other Ambulatory Visit: Payer: Self-pay

## 2021-05-24 ENCOUNTER — Other Ambulatory Visit (HOSPITAL_COMMUNITY)
Admission: RE | Admit: 2021-05-24 | Discharge: 2021-05-24 | Disposition: A | Discharge status: Home / Self Care | Payer: 59 | Source: Ambulatory Visit | Attending: Family Medicine | Admitting: Family Medicine

## 2021-05-24 VITALS — BP 137/80 | HR 83 | Temp 97.9°F | Resp 17

## 2021-05-24 DIAGNOSIS — J029 Acute pharyngitis, unspecified: Secondary | ICD-10-CM | POA: Insufficient documentation

## 2021-05-24 LAB — POCT RAPID STREP A (OFFICE): Rapid Strep A Screen: NEGATIVE

## 2021-05-24 MED ORDER — AZITHROMYCIN 250 MG PO TABS: 250.0000 mg | ORAL_TABLET | Freq: Every day | ORAL | 0 refills | Status: DC

## 2021-05-24 MED ORDER — PREDNISONE 20 MG PO TABS: ORAL_TABLET | ORAL | 0 refills | Status: DC

## 2021-05-24 NOTE — ED Provider Notes (Signed)
[facility]    CSN: [id number] Arrival date & time: 05/24/21  1800      History   Chief Complaint Chief Complaint  Patient presents with   Sore Throat    6pm appt   Cough    HPI Kylie Castillo is a 51 y.o. female.   HPI 51 year old female presents with sore throat and cough for 6 to 7 days.  Past Medical History:  Diagnosis Date   Allergy    Chronic kidney disease    hx of kidney infections   Joint pain     Patient Active Problem List   Diagnosis Date Noted   Hot flashes 01/18/2020   Polyarthralgia 01/18/2020    Past Surgical History:  Procedure Laterality Date   HAND SURGERY Right    TUBAL LIGATION     WISDOM TOOTH EXTRACTION      OB History   No obstetric history on file.      Home Medications    Prior to Admission medications   Medication Sig Start Date End Date Taking? Authorizing Provider  azithromycin (ZITHROMAX) 250 MG tablet Take 1 tablet (250 mg total) by mouth daily. Take first 2 tablets together, then 1 every day until finished. 05/24/21  Yes Eliezer Lofts, FNP  predniSONE (DELTASONE) 20 MG tablet Take 3 tabs PO daily x 5 days. 05/24/21  Yes Eliezer Lofts, FNP  BEE POLLEN PO Take by mouth.    [provider]  COVID-19 At Home Antigen Test [facility] COVID-19 HOME TEST) KIT Use as directed within package instructions 11/29/20   Edmon Crape, [facility]  Cyanocobalamin (B-12) 2500 MCG TABS Take 1 tablet by mouth daily.    [provider]  DULoxetine (CYMBALTA) 60 MG capsule TAKE 1 CAPSULE (60 MG TOTAL) BY MOUTH DAILY. 04/10/21 04/10/22  Sharion Balloon, FNP  Multiple Vitamin (MULTIVITAMIN ADULT PO) Take by mouth.    [provider]    Family History Family History  Problem Relation Age of Onset   Stroke Mother    Hyperlipidemia Mother    Other Mother        GIST   Heart disease Father    Diabetes Father    Hypertension Father    Hyperlipidemia Father    Alcohol abuse Brother    Stroke Brother     Ovarian cancer Maternal Aunt    Anxiety disorder Maternal Aunt    Depression Maternal Aunt    Heart disease Maternal Aunt    Colon cancer Maternal Grandmother    Lung cancer Maternal Grandfather    Heart disease Maternal Grandfather    Hyperlipidemia Maternal Grandfather    Heart attack Paternal Grandfather    Heart disease Paternal Grandfather    Hyperlipidemia Paternal Grandfather    Esophageal cancer Neg Hx    Stomach cancer Neg Hx    Rectal cancer Neg Hx     Social History Social History   Tobacco Use   Smoking status: Former    Packs/day: 1.00    Types: Cigarettes    Quit date: 08/01/2016    Years since quitting: 4.8   Smokeless tobacco: Never  Vaping Use   Vaping Use: Never used  Substance Use Topics   Alcohol use: Not Currently    Alcohol/week: 0.0 standard drinks   Drug use: Never     Allergies   Iodine and Varenicline tartrate   Review of Systems Review of Systems  HENT:  Positive for sore throat.     Physical Exam  Triage Vital Signs ED Triage Vitals  Enc Vitals Group     BP 05/24/21 1809 137/80     Pulse Rate 05/24/21 1809 83     Resp 05/24/21 1809 17     Temp 05/24/21 1809 97.9 F (36.6 C)     Temp Source 05/24/21 1809 Oral     SpO2 05/24/21 1809 98 %     Weight --      Height --      Head Circumference --      Peak Flow --      Pain Score 05/24/21 1811 8     Pain Loc --      Pain Edu? --      Excl. in [place]? --    No data found.  Updated Vital Signs BP 137/80 (BP Location: Right Arm)    Pulse 83    Temp 97.9 F (36.6 C) (Oral)    Resp 17    LMP 02/21/2015    SpO2 98%      Physical Exam Vitals and nursing note reviewed.  Constitutional:      Appearance: She is well-developed and normal weight.  HENT:     Head: Normocephalic and atraumatic.     Right Ear: Tympanic membrane and ear canal normal.     Left Ear: Tympanic membrane and ear canal normal.     Mouth/Throat:     Mouth: Mucous membranes are moist.     Pharynx: Oropharynx is  clear. Uvula midline. Posterior oropharyngeal erythema and uvula swelling present.  Eyes:     Conjunctiva/sclera: Conjunctivae normal.     Pupils: Pupils are equal, round, and reactive to light.  Cardiovascular:     Rate and Rhythm: Normal rate and regular rhythm.     Heart sounds: Normal heart sounds.  Pulmonary:     Effort: Pulmonary effort is normal.     Breath sounds: Normal breath sounds.  Musculoskeletal:     Cervical back: Normal range of motion and neck supple.  Skin:    General: Skin is warm and dry.  Neurological:     General: No focal deficit present.     Mental Status: She is alert and oriented to person, place, and time.     UC Treatments / Results  Labs (all labs ordered are listed, but only abnormal results are displayed) Labs Reviewed  POCT RAPID STREP A (OFFICE)    EKG   Radiology No results found.  Procedures Procedures (including critical care time)  Medications Ordered in UC Medications - No data to display  Initial Impression / Assessment and Plan / UC Course  I have reviewed the triage vital signs and the nursing notes.  Pertinent labs & imaging results that were available during my care of the patient were reviewed by me and considered in my medical decision making (see chart for details).     MDM: 1.  Acute pharyngitis-Rx'd Zithromax and Prednisone. Advised patient to take medication as directed with food to completion.  Encouraged patient increase daily water intake while taking these medications.  Patient discharged home, hemodynamically stable. Final Clinical Impressions(s) / UC Diagnoses   Final diagnoses:  Acute pharyngitis, unspecified etiology     Discharge Instructions      Advised patient to take medication as directed with food to completion.  Encouraged patient increase daily water intake while taking these medications.     ED Prescriptions     Medication Sig Dispense Auth. Provider   azithromycin (ZITHROMAX) 250 MG  tablet Take 1 tablet (250 mg total) by mouth daily. Take first 2 tablets together, then 1 every day until finished. 6 tablet Eliezer Lofts, FNP   predniSONE (DELTASONE) 20 MG tablet Take 3 tabs PO daily x 5 days. 15 tablet Eliezer Lofts, FNP      PDMP not reviewed this encounter.   Eliezer Lofts, [place] 05/24/21 1913

## 2021-05-24 NOTE — Discharge Instructions (Addendum)
Advised patient to take medication as directed with food to completion.  Encouraged patient increase daily water intake while taking these medications.

## 2021-05-24 NOTE — ED Triage Notes (Signed)
Pt c/o sore throat and cough since last Friday. Denies fever. Worsening since Tues eve. COVID tested on Friday, which was negative. Ibuprofen prn.

## 2021-05-27 LAB — CULTURE, GROUP A STREP (THRC)

## 2021-06-11 ENCOUNTER — Encounter: Payer: 59 | Admitting: Family

## 2021-07-06 ENCOUNTER — Other Ambulatory Visit: Payer: Self-pay | Admitting: Family

## 2021-07-06 DIAGNOSIS — R232 Flushing: Secondary | ICD-10-CM

## 2021-07-06 DIAGNOSIS — M2550 Pain in unspecified joint: Secondary | ICD-10-CM

## 2021-07-06 DIAGNOSIS — Z0000 Encounter for general adult medical examination without abnormal findings: Secondary | ICD-10-CM

## 2021-12-22 ENCOUNTER — Telehealth: Payer: Federal, State, Local not specified - PPO | Admitting: Nurse Practitioner

## 2021-12-22 DIAGNOSIS — N3000 Acute cystitis without hematuria: Secondary | ICD-10-CM

## 2021-12-22 MED ORDER — CEPHALEXIN 500 MG PO CAPS: 500.0000 mg | ORAL_CAPSULE | Freq: Two times a day (BID) | ORAL | 0 refills | Status: DC

## 2021-12-22 NOTE — Progress Notes (Signed)

## 2022-04-19 DIAGNOSIS — H5213 Myopia, bilateral: Secondary | ICD-10-CM | POA: Diagnosis not present

## 2022-08-09 ENCOUNTER — Ambulatory Visit (INDEPENDENT_AMBULATORY_CARE_PROVIDER_SITE_OTHER): Payer: Federal, State, Local not specified - PPO | Admitting: Family

## 2022-08-09 ENCOUNTER — Other Ambulatory Visit (HOSPITAL_COMMUNITY): Payer: Self-pay

## 2022-08-09 ENCOUNTER — Encounter (HOSPITAL_COMMUNITY): Payer: Self-pay

## 2022-08-09 ENCOUNTER — Other Ambulatory Visit: Payer: Self-pay

## 2022-08-09 ENCOUNTER — Encounter: Payer: Self-pay | Admitting: Family

## 2022-08-09 VITALS — BP 117/77 | HR 84 | Temp 97.6°F | Ht 65.0 in | Wt 158.4 lb

## 2022-08-09 DIAGNOSIS — Z8249 Family history of ischemic heart disease and other diseases of the circulatory system: Secondary | ICD-10-CM

## 2022-08-09 DIAGNOSIS — Z23 Encounter for immunization: Secondary | ICD-10-CM | POA: Diagnosis not present

## 2022-08-09 DIAGNOSIS — M2550 Pain in unspecified joint: Secondary | ICD-10-CM

## 2022-08-09 DIAGNOSIS — Z0001 Encounter for general adult medical examination with abnormal findings: Secondary | ICD-10-CM

## 2022-08-09 DIAGNOSIS — Z0000 Encounter for general adult medical examination without abnormal findings: Secondary | ICD-10-CM

## 2022-08-09 MED ORDER — DICLOFENAC SODIUM 75 MG PO TBEC
75.0000 mg | DELAYED_RELEASE_TABLET | Freq: Two times a day (BID) | ORAL | 4 refills | Status: DC
  Filled 2022-08-09 – 2022-08-17 (×2): qty 180, 90d supply, fill #0
  Filled 2022-11-12: qty 180, 90d supply, fill #1
  Filled 2023-02-11: qty 180, 90d supply, fill #2
  Filled 2023-05-11: qty 180, 90d supply, fill #3
  Filled 2023-08-03: qty 180, 90d supply, fill #4

## 2022-08-09 NOTE — Progress Notes (Signed)
Subjective:    Patient ID: Kylie Castillo, female    DOB: 05/04/1971, 52 y.o.   MRN: [id number]  Chief Complaint  Patient presents with   Annual Exam    [facility]    PT presents to the office today for CPE without pap. Pt reports a strong family history of cardiovascular requesting Cardiologists referral.  Arthritis Presents for follow-up visit. Affected locations include the left knee, right knee, right hip, right wrist and left wrist. Her pain is at a severity of 2/10 (with diclofenac).      Review of Systems  Musculoskeletal:  Positive for arthritis.  All other systems reviewed and are negative.  Family History  Problem Relation Age of Onset   Stroke Mother    Hyperlipidemia Mother    Other Mother        GIST   Heart disease Father    Diabetes Father    Hypertension Father    Hyperlipidemia Father    Alcohol abuse Brother    Stroke Brother    Ovarian cancer Maternal Aunt    Anxiety disorder Maternal Aunt    Depression Maternal Aunt    Heart disease Maternal Aunt    Colon cancer Maternal Grandmother    Lung cancer Maternal Grandfather    Heart disease Maternal Grandfather    Hyperlipidemia Maternal Grandfather    Heart attack Paternal Grandfather    Heart disease Paternal Grandfather    Hyperlipidemia Paternal Grandfather    Esophageal cancer Neg Hx    Stomach cancer Neg Hx    Rectal cancer Neg Hx    Social History   Socioeconomic History   Marital status: Married    Spouse name: Not on file   Number of children: Not on file   Years of education: Not on file   Highest education level: Not on file  Occupational History   Not on file  Tobacco Use   Smoking status: Former    Packs/day: 1    Types: Cigarettes    Quit date: 08/01/2016    Years since quitting: 6.0   Smokeless tobacco: Never  Vaping Use   Vaping Use: Never used  Substance and Sexual Activity   Alcohol use: Not Currently    Alcohol/week: 0.0 standard drinks of  alcohol   Drug use: Never   Sexual activity: Yes    Birth control/protection: Surgical  Other Topics Concern   Not on file  Social History Narrative   Not on file   Social Determinants of Health   Financial Resource Strain: Not on file  Food Insecurity: Not on file  Transportation Needs: Not on file  Physical Activity: Not on file  Stress: Not on file  Social Connections: Not on file       Objective:   Physical Exam Vitals reviewed.  Constitutional:      General: She is not in acute distress.    Appearance: She is well-developed.  HENT:     Head: Normocephalic and atraumatic.     Right Ear: Tympanic membrane normal.     Left Ear: Tympanic membrane normal.  Eyes:     Pupils: Pupils are equal, round, and reactive to light.  Neck:     Thyroid: No thyromegaly.  Cardiovascular:     Rate and Rhythm: Normal rate and regular rhythm.     Heart sounds: Normal heart sounds. No murmur heard. Pulmonary:     Effort: Pulmonary effort is normal. No respiratory distress.  Breath sounds: Normal breath sounds. No wheezing.  Abdominal:     General: Bowel sounds are normal. There is no distension.     Palpations: Abdomen is soft.     Tenderness: There is no abdominal tenderness.  Musculoskeletal:        General: No tenderness. Normal range of motion.     Cervical back: Normal range of motion and neck supple.  Skin:    General: Skin is warm and dry.  Neurological:     Mental Status: She is alert and oriented to person, place, and time.     Cranial Nerves: No cranial nerve deficit.     Deep Tendon Reflexes: Reflexes are normal and symmetric.  Psychiatric:        Behavior: Behavior normal.        Thought Content: Thought content normal.        Judgment: Judgment normal.       BP 117/77   Pulse 84   Temp 97.6 F (36.4 C) (Temporal)   Ht 5\' 5"  (1.651 m)   Wt 158 lb 6.4 oz (71.8 kg)   LMP 02/21/2015   SpO2 99%   BMI 26.36 kg/m      Assessment & Plan:  LAURYL RIEMERSMA  comes in today with chief complaint of Annual Exam ([place] heard heart murmur last January )   Diagnosis and orders addressed:  1. Annual physical exam - CMP14+EGFR - CBC with Differential/Platelet - Lipid panel - TSH  2. Polyarthralgia - diclofenac (VOLTAREN) 75 MG EC tablet; Take 1 tablet (75 mg total) by mouth 2 (two) times daily.  Dispense: 180 tablet; Refill: 4  3. Family history of cardiovascular disease - Ambulatory referral to Cardiology  Labs pending Health Maintenance reviewed Diet and exercise encouraged  Follow up plan: 1 year   Evelina Dun, FNP

## 2022-08-09 NOTE — Patient Instructions (Signed)

## 2022-08-10 LAB — LIPID PANEL
Chol/HDL Ratio: 4.8 ratio — ABNORMAL HIGH (ref 0.0–4.4)
Cholesterol, Total: 215 mg/dL — ABNORMAL HIGH (ref 100–199)
HDL: 45 mg/dL (ref >39)
LDL Chol Calc (NIH): 149 mg/dL — ABNORMAL HIGH (ref 0–99)
Triglycerides: 116 mg/dL (ref 0–149)
VLDL Cholesterol Cal: 21 mg/dL (ref 5–40)

## 2022-08-10 LAB — CBC WITH DIFFERENTIAL/PLATELET
Basophils Absolute: 0.1 10*3/uL (ref 0.0–0.2)
Basos: 1 %
EOS (ABSOLUTE): 0.2 10*3/uL (ref 0.0–0.4)
Eos: 3 %
Hematocrit: 40.7 % (ref 34.0–46.6)
Hemoglobin: 13.4 g/dL (ref 11.1–15.9)
Immature Grans (Abs): 0 10*3/uL (ref 0.0–0.1)
Immature Granulocytes: 0 %
Lymphocytes Absolute: 1.7 10*3/uL (ref 0.7–3.1)
Lymphs: 31 %
MCH: 28.8 pg (ref 26.6–33.0)
MCHC: 32.9 g/dL (ref 31.5–35.7)
MCV: 88 fL (ref 79–97)
Monocytes Absolute: 0.5 10*3/uL (ref 0.1–0.9)
Monocytes: 9 %
Neutrophils Absolute: 3.1 10*3/uL (ref 1.4–7.0)
Neutrophils: 56 %
Platelets: 322 10*3/uL (ref 150–450)
RBC: 4.65 x10E6/uL (ref 3.77–5.28)
RDW: 12.7 % (ref 11.7–15.4)
WBC: 5.6 10*3/uL (ref 3.4–10.8)

## 2022-08-10 LAB — CMP14+EGFR
ALT: 11 IU/L (ref 0–32)
AST: 16 IU/L (ref 0–40)
Albumin/Globulin Ratio: 1.7 (ref 1.2–2.2)
Albumin: 4.3 g/dL (ref 3.8–4.9)
Alkaline Phosphatase: 110 IU/L (ref 44–121)
BUN/Creatinine Ratio: 14 (ref 9–23)
BUN: 14 mg/dL (ref 6–24)
Bilirubin Total: 0.5 mg/dL (ref 0.0–1.2)
CO2: 23 mmol/L (ref 20–29)
Calcium: 9.9 mg/dL (ref 8.7–10.2)
Chloride: 104 mmol/L (ref 96–106)
Creatinine, Ser: 0.97 mg/dL (ref 0.57–1.00)
Globulin, Total: 2.6 g/dL (ref 1.5–4.5)
Glucose: 82 mg/dL (ref 70–99)
Potassium: 4.8 mmol/L (ref 3.5–5.2)
Sodium: 141 mmol/L (ref 134–144)
Total Protein: 6.9 g/dL (ref 6.0–8.5)
eGFR: 71 mL/min/{1.73_m2} (ref >59)

## 2022-08-10 LAB — TSH: TSH: 0.954 u[IU]/mL (ref 0.450–4.500)

## 2022-08-12 ENCOUNTER — Other Ambulatory Visit (HOSPITAL_COMMUNITY): Payer: Self-pay

## 2022-08-12 ENCOUNTER — Other Ambulatory Visit: Payer: Self-pay | Admitting: Family

## 2022-08-12 MED ORDER — ROSUVASTATIN CALCIUM 10 MG PO TABS
10.0000 mg | ORAL_TABLET | Freq: Every day | ORAL | 3 refills | Status: DC
  Filled 2023-02-04: qty 90, 90d supply, fill #0
  Filled 2023-05-11: qty 90, 90d supply, fill #1

## 2022-08-14 ENCOUNTER — Other Ambulatory Visit: Payer: Self-pay

## 2022-08-15 NOTE — Progress Notes (Unsigned)
  Cardiology Office Note:   Date:  08/15/2022  ID:  Kylie Castillo, DOB 03/27/1971, MRN [id number]  History of Present Illness:   Kylie Castillo is a 52 y.o. female with HLD who was referred by Evelina Dun for further evaluation of family history of CAD.  Today, ***  ROS: ***  Studies Reviewed:    EKG:  ***      Risk Assessment/Calculations:   {Does this patient have ATRIAL FIBRILLATION?:[id number]} No BP recorded.  {Refresh Note OR Click here to enter BP  :1}***        Physical Exam:   VS:  LMP 02/21/2015    Wt Readings from Last 3 Encounters:  08/09/22 158 lb 6.4 oz (71.8 kg)  11/21/20 160 lb 9.6 oz (72.8 kg)  08/18/20 153 lb (69.4 kg)     GEN: Well nourished, well developed in no acute distress NECK: No JVD; No carotid bruits CARDIAC: ***RRR, no murmurs, rubs, gallops RESPIRATORY:  Clear to auscultation without rales, wheezing or rhonchi  ABDOMEN: Soft, non-tender, non-distended EXTREMITIES:  No edema; No deformity   ASSESSMENT AND PLAN:    #Family History of CAD: -Check Ca score for screening -Check NMR, Lp(a), apolipoprotein B -Continue crestor 10mg  daily    {Are you ordering a CV Procedure (e.g. stress test, cath, DCCV, TEE, etc)?   Press F2        :[id number]   Signed, Freada Bergeron, MD

## 2022-08-17 ENCOUNTER — Other Ambulatory Visit (HOSPITAL_COMMUNITY): Payer: Self-pay

## 2022-08-19 ENCOUNTER — Ambulatory Visit: Payer: Federal, State, Local not specified - PPO | Attending: Cardiology | Admitting: Cardiology

## 2022-08-19 ENCOUNTER — Encounter: Payer: Self-pay | Admitting: Cardiology

## 2022-08-19 ENCOUNTER — Ambulatory Visit: Payer: Federal, State, Local not specified - PPO | Admitting: Cardiology

## 2022-08-19 VITALS — BP 112/78 | HR 67 | Ht 65.0 in | Wt 158.0 lb

## 2022-08-19 DIAGNOSIS — R011 Cardiac murmur, unspecified: Secondary | ICD-10-CM

## 2022-08-19 DIAGNOSIS — E7800 Pure hypercholesterolemia, unspecified: Secondary | ICD-10-CM | POA: Diagnosis not present

## 2022-08-19 DIAGNOSIS — Z8249 Family history of ischemic heart disease and other diseases of the circulatory system: Secondary | ICD-10-CM

## 2022-08-19 NOTE — Patient Instructions (Signed)
Medication Instructions:  Your physician recommends that you continue on your current medications as directed. Please refer to the Current Medication list given to you today.  *If you need a refill on your cardiac medications before your next appointment, please call your pharmacy*  Lab Work: In 8 weeks: NMR lipoprofile, apolipoprotein B, LP (a) If you have labs (blood work) drawn today and your tests are completely normal, you will receive your results only by: [facility] (if you have MyChart) OR A paper copy in the mail If you have any lab test that is abnormal or we need to change your treatment, we will call you to review the results.  Testing/Procedures: Your physician has requested that you have an echocardiogram. Echocardiography is a painless test that uses sound waves to create images of your heart. It provides your doctor with information about the size and shape of your heart and how well your heart's chambers and valves are working. This procedure takes approximately one hour. There are no restrictions for this procedure. Please do NOT wear cologne, perfume, aftershave, or lotions (deodorant is allowed). Please arrive 15 minutes prior to your appointment time.  Your physician has requested that you have a coronary calcium score performed. This is not covered by insurance and will be an out-of-pocket cost of approximately $99.   Follow-Up: As needed with Dr. Gwyndolyn Kaufman or APP

## 2022-09-09 ENCOUNTER — Ambulatory Visit (HOSPITAL_COMMUNITY)
Admission: RE | Admit: 2022-09-09 | Discharge: 2022-09-09 | Disposition: A | Discharge status: Home / Self Care | Payer: Commercial Managed Care - PPO | Source: Ambulatory Visit | Attending: Cardiology | Admitting: Cardiology

## 2022-09-09 DIAGNOSIS — R011 Cardiac murmur, unspecified: Secondary | ICD-10-CM | POA: Diagnosis not present

## 2022-09-09 DIAGNOSIS — E7800 Pure hypercholesterolemia, unspecified: Secondary | ICD-10-CM | POA: Insufficient documentation

## 2022-09-09 DIAGNOSIS — Z8249 Family history of ischemic heart disease and other diseases of the circulatory system: Secondary | ICD-10-CM | POA: Insufficient documentation

## 2022-09-09 DIAGNOSIS — R911 Solitary pulmonary nodule: Secondary | ICD-10-CM | POA: Insufficient documentation

## 2022-09-09 LAB — ECHOCARDIOGRAM COMPLETE
Area-P 1/2: 3.37 cm2
Calc EF: 55.8 %
S' Lateral: 2.8 cm
Single Plane A2C EF: 56.1 %
Single Plane A4C EF: 54.6 %

## 2022-09-09 NOTE — Progress Notes (Signed)
Echocardiogram 2D Echocardiogram has been performed.  Augustine Radar 09/09/2022, 1:44 PM

## 2022-10-17 ENCOUNTER — Ambulatory Visit: Payer: Commercial Managed Care - PPO | Attending: Cardiology

## 2022-10-17 DIAGNOSIS — E7800 Pure hypercholesterolemia, unspecified: Secondary | ICD-10-CM

## 2022-10-18 LAB — NMR, LIPOPROFILE
Cholesterol, Total: 130 mg/dL (ref 100–199)
HDL Particle Number: 36.4 umol/L (ref >=30.5)
HDL-C: 54 mg/dL (ref >39)
LDL Particle Number: 750 nmol/L (ref <1000)
LDL Size: 21.2 nm (ref >20.5)
LDL-C (NIH Calc): 61 mg/dL (ref 0–99)
LP-IR Score: 41 (ref <=45)
Small LDL Particle Number: 407 nmol/L (ref <=527)
Triglycerides: 72 mg/dL (ref 0–149)

## 2022-10-18 LAB — APOLIPOPROTEIN B: Apolipoprotein B: 58 mg/dL (ref <90)

## 2022-10-18 LAB — LIPOPROTEIN A (LPA): Lipoprotein (a): 14.2 nmol/L (ref <75.0)

## 2022-10-24 ENCOUNTER — Ambulatory Visit: Payer: Federal, State, Local not specified - PPO | Admitting: Family Medicine

## 2022-11-12 ENCOUNTER — Other Ambulatory Visit (HOSPITAL_COMMUNITY): Payer: Self-pay

## 2022-11-27 ENCOUNTER — Other Ambulatory Visit: Payer: Self-pay | Admitting: Oncology

## 2022-11-27 DIAGNOSIS — Z006 Encounter for examination for normal comparison and control in clinical research program: Secondary | ICD-10-CM

## 2022-12-06 ENCOUNTER — Other Ambulatory Visit (HOSPITAL_COMMUNITY)
Admission: RE | Admit: 2022-12-06 | Discharge: 2022-12-06 | Disposition: A | Discharge status: Home / Self Care | Payer: Commercial Managed Care - PPO | Source: Ambulatory Visit | Attending: Oncology | Admitting: Oncology

## 2022-12-06 DIAGNOSIS — Z006 Encounter for examination for normal comparison and control in clinical research program: Secondary | ICD-10-CM | POA: Insufficient documentation

## 2023-02-04 ENCOUNTER — Other Ambulatory Visit: Payer: Self-pay

## 2023-02-04 ENCOUNTER — Other Ambulatory Visit (HOSPITAL_COMMUNITY): Payer: Self-pay

## 2023-02-11 ENCOUNTER — Other Ambulatory Visit (HOSPITAL_COMMUNITY): Payer: Self-pay

## 2023-02-17 ENCOUNTER — Telehealth: Payer: Self-pay | Admitting: Family

## 2023-02-17 NOTE — Telephone Encounter (Signed)
Pt called to schedule her 2nd shingles shot. She had her 1st shingles shot more than 6 months ago. Can she get her 2nd shingles shot? Or does she have to get the 1st shingles shot again since its been more than 6 months?

## 2023-02-18 ENCOUNTER — Other Ambulatory Visit (HOSPITAL_COMMUNITY): Payer: Self-pay | Admitting: Family

## 2023-02-18 DIAGNOSIS — Z1231 Encounter for screening mammogram for malignant neoplasm of breast: Secondary | ICD-10-CM

## 2023-02-27 ENCOUNTER — Ambulatory Visit: Payer: Commercial Managed Care - PPO

## 2023-02-27 ENCOUNTER — Encounter (HOSPITAL_COMMUNITY): Payer: Self-pay

## 2023-02-27 ENCOUNTER — Ambulatory Visit (HOSPITAL_COMMUNITY)
Admission: RE | Admit: 2023-02-27 | Discharge: 2023-02-27 | Disposition: A | Discharge status: Home / Self Care | Payer: Commercial Managed Care - PPO | Source: Ambulatory Visit | Attending: Family | Admitting: Family

## 2023-02-27 ENCOUNTER — Inpatient Hospital Stay (HOSPITAL_COMMUNITY): Admission: RE | Admit: 2023-02-27 | Payer: Commercial Managed Care - PPO | Source: Ambulatory Visit

## 2023-02-27 DIAGNOSIS — Z23 Encounter for immunization: Secondary | ICD-10-CM | POA: Diagnosis not present

## 2023-02-27 DIAGNOSIS — Z1231 Encounter for screening mammogram for malignant neoplasm of breast: Secondary | ICD-10-CM

## 2023-05-12 ENCOUNTER — Other Ambulatory Visit: Payer: Self-pay

## 2023-07-29 ENCOUNTER — Other Ambulatory Visit (HOSPITAL_COMMUNITY): Payer: Self-pay

## 2023-08-03 ENCOUNTER — Other Ambulatory Visit: Payer: Self-pay | Admitting: Family

## 2023-08-04 ENCOUNTER — Other Ambulatory Visit (HOSPITAL_COMMUNITY): Payer: Self-pay

## 2023-08-04 ENCOUNTER — Other Ambulatory Visit: Payer: Self-pay

## 2023-08-04 MED ORDER — ROSUVASTATIN CALCIUM 10 MG PO TABS
10.0000 mg | ORAL_TABLET | Freq: Every day | ORAL | 0 refills | Status: DC
  Filled 2023-08-04: qty 30, 30d supply, fill #0

## 2023-08-04 NOTE — Telephone Encounter (Signed)
 Pt scheduled appt 08/19/2023 Please send refill to pharm

## 2023-08-04 NOTE — Telephone Encounter (Signed)
 30 day supply sent in pt ntbs with PCP for any further refills.

## 2023-08-19 ENCOUNTER — Encounter: Payer: Self-pay | Admitting: Family

## 2023-08-19 ENCOUNTER — Ambulatory Visit (INDEPENDENT_AMBULATORY_CARE_PROVIDER_SITE_OTHER): Admitting: Family

## 2023-08-19 ENCOUNTER — Other Ambulatory Visit (HOSPITAL_COMMUNITY): Payer: Self-pay

## 2023-08-19 VITALS — BP 121/81 | HR 65 | Temp 97.7°F | Ht 65.0 in | Wt 165.0 lb

## 2023-08-19 DIAGNOSIS — Z0000 Encounter for general adult medical examination without abnormal findings: Secondary | ICD-10-CM

## 2023-08-19 DIAGNOSIS — Z0001 Encounter for general adult medical examination with abnormal findings: Secondary | ICD-10-CM | POA: Diagnosis not present

## 2023-08-19 DIAGNOSIS — M2550 Pain in unspecified joint: Secondary | ICD-10-CM

## 2023-08-19 DIAGNOSIS — E785 Hyperlipidemia, unspecified: Secondary | ICD-10-CM | POA: Diagnosis not present

## 2023-08-19 MED ORDER — ROSUVASTATIN CALCIUM 10 MG PO TABS
10.0000 mg | ORAL_TABLET | Freq: Every day | ORAL | 3 refills | Status: AC
  Filled 2023-08-19 – 2023-09-04 (×2): qty 90, 90d supply, fill #0
  Filled 2023-12-03: qty 90, 90d supply, fill #1
  Filled 2024-02-26: qty 90, 90d supply, fill #2
  Filled 2024-05-30: qty 90, 90d supply, fill #3

## 2023-08-19 MED ORDER — DICLOFENAC SODIUM 75 MG PO TBEC
75.0000 mg | DELAYED_RELEASE_TABLET | Freq: Two times a day (BID) | ORAL | 4 refills | Status: DC
  Filled 2023-08-19 – 2023-10-30 (×2): qty 180, 90d supply, fill #0

## 2023-08-19 NOTE — Patient Instructions (Signed)

## 2023-08-19 NOTE — Progress Notes (Signed)
 Subjective:    Patient ID: Kylie Castillo, female    DOB: 10-Sep-1970, 53 y.o.   MRN: [id number]  Chief Complaint  Patient presents with   Annual Exam   PT presents to the office today for CPE without pap.  Arthritis Presents for follow-up visit. She complains of pain and stiffness. Affected locations include the left knee, right knee, right hip, right wrist, left wrist, left hip, right elbow and left elbow. Her pain is at a severity of 7/10 (with diclofenac).  Hyperlipidemia This is a chronic problem. The current episode started more than 1 year ago. The problem is uncontrolled. Pertinent negatives include no shortness of breath. Current antihyperlipidemic treatment includes statins. Risk factors for coronary artery disease include dyslipidemia, hypertension and a sedentary lifestyle.      Review of Systems  Constitutional: Negative.   HENT: Negative.    Eyes: Negative.   Respiratory: Negative.  Negative for shortness of breath.   Cardiovascular: Negative.  Negative for palpitations.  Gastrointestinal: Negative.   Endocrine: Negative.   Genitourinary: Negative.   Musculoskeletal:  Positive for stiffness.  Neurological: Negative.  Negative for headaches.  Hematological: Negative.   Psychiatric/Behavioral: Negative.    All other systems reviewed and are negative.  Family History  Problem Relation Age of Onset   Stroke Mother    Hyperlipidemia Mother    Other Mother        GIST   Heart disease Father    Diabetes Father    Hypertension Father    Hyperlipidemia Father    Alcohol abuse Brother    Stroke Brother    Ovarian cancer Maternal Aunt    Anxiety disorder Maternal Aunt    Depression Maternal Aunt    Heart disease Maternal Aunt    Colon cancer Maternal Grandmother    Lung cancer Maternal Grandfather    Heart disease Maternal Grandfather    Hyperlipidemia Maternal Grandfather    Heart attack Paternal Grandfather    Heart disease Paternal Grandfather     Hyperlipidemia Paternal Grandfather    Esophageal cancer Neg Hx    Stomach cancer Neg Hx    Rectal cancer Neg Hx    Social History   Socioeconomic History   Marital status: Married    Spouse name: Not on file   Number of children: Not on file   Years of education: Not on file   Highest education level: Not on file  Occupational History   Not on file  Tobacco Use   Smoking status: Former    Current packs/day: 0.00    Types: Cigarettes    Quit date: 08/01/2016    Years since quitting: 7.0   Smokeless tobacco: Never  Vaping Use   Vaping status: Never Used  Substance and Sexual Activity   Alcohol use: Not Currently    Alcohol/week: 0.0 standard drinks of alcohol   Drug use: Never   Sexual activity: Yes    Birth control/protection: Surgical  Other Topics Concern   Not on file  Social History Narrative   Not on file   Social Drivers of Health   Financial Resource Strain: Not on file  Food Insecurity: Not on file  Transportation Needs: Not on file  Physical Activity: Not on file  Stress: Not on file  Social Connections: Not on file       Objective:   Physical Exam Vitals reviewed.  Constitutional:      General: She is not in acute distress.    Appearance: She  is well-developed.  HENT:     Head: Normocephalic and atraumatic.     Right Ear: Tympanic membrane normal.     Left Ear: Tympanic membrane normal.  Eyes:     Pupils: Pupils are equal, round, and reactive to light.  Neck:     Thyroid: No thyromegaly.  Cardiovascular:     Rate and Rhythm: Normal rate and regular rhythm.     Heart sounds: Normal heart sounds. No murmur heard. Pulmonary:     Effort: Pulmonary effort is normal. No respiratory distress.     Breath sounds: Normal breath sounds. No wheezing.  Abdominal:     General: Bowel sounds are normal. There is no distension.     Palpations: Abdomen is soft.     Tenderness: There is no abdominal tenderness.  Musculoskeletal:        General: Tenderness  present. Normal range of motion.     Cervical back: Normal range of motion and neck supple.     Comments: Pain in left knee with flexion  Skin:    General: Skin is warm and dry.  Neurological:     Mental Status: She is alert and oriented to person, place, and time.     Cranial Nerves: No cranial nerve deficit.     Deep Tendon Reflexes: Reflexes are normal and symmetric.  Psychiatric:        Behavior: Behavior normal.        Thought Content: Thought content normal.        Judgment: Judgment normal.       BP 121/81   Pulse 65   Temp 97.7 F (36.5 C) (Temporal)   Ht 5\' 5"  (1.651 m)   Wt 165 lb (74.8 kg)   LMP 02/21/2015   SpO2 96%   BMI 27.46 kg/m      Assessment & Plan:  Kylie Castillo comes in today with chief complaint of Annual Exam   Diagnosis and orders addressed:  1. Polyarthralgia - diclofenac (VOLTAREN) 75 MG EC tablet; Take 1 tablet (75 mg total) by mouth 2 (two) times daily.  Dispense: 180 tablet; Refill: 4 - CMP14+EGFR - CBC with Differential/Platelet - Rheumatoid factor - TSH  2. Annual physical exam (Primary) - diclofenac (VOLTAREN) 75 MG EC tablet; Take 1 tablet (75 mg total) by mouth 2 (two) times daily.  Dispense: 180 tablet; Refill: 4 - CMP14+EGFR - CBC with Differential/Platelet - Lipid panel - Rheumatoid factor - TSH  3. Hyperlipidemia, unspecified hyperlipidemia type - rosuvastatin (CRESTOR) 10 MG tablet; Take 1 tablet (10 mg total) by mouth daily. Need to be seen for further refills  Dispense: 90 tablet; Refill: 3 - CMP14+EGFR - CBC with Differential/Platelet - Lipid panel   Labs pending Continue current medications  Rheumatoid factor pending  Continue diclofenac BID, no other NSAID's  Health Maintenance reviewed Diet and exercise encouraged  Follow up plan: 1 year   Jannifer Rodney, FNP

## 2023-08-20 LAB — CMP14+EGFR
ALT: 9 IU/L (ref 0–32)
AST: 18 IU/L (ref 0–40)
Albumin: 4.4 g/dL (ref 3.8–4.9)
Alkaline Phosphatase: 96 IU/L (ref 44–121)
BUN/Creatinine Ratio: 12 (ref 9–23)
BUN: 12 mg/dL (ref 6–24)
Bilirubin Total: 0.6 mg/dL (ref 0.0–1.2)
CO2: 24 mmol/L (ref 20–29)
Calcium: 9.9 mg/dL (ref 8.7–10.2)
Chloride: 102 mmol/L (ref 96–106)
Creatinine, Ser: 1.02 mg/dL — ABNORMAL HIGH (ref 0.57–1.00)
Globulin, Total: 2.4 g/dL (ref 1.5–4.5)
Glucose: 82 mg/dL (ref 70–99)
Potassium: 4.7 mmol/L (ref 3.5–5.2)
Sodium: 140 mmol/L (ref 134–144)
Total Protein: 6.8 g/dL (ref 6.0–8.5)
eGFR: 66 mL/min/{1.73_m2} (ref >59)

## 2023-08-20 LAB — CBC WITH DIFFERENTIAL/PLATELET
Basophils Absolute: 0.1 10*3/uL (ref 0.0–0.2)
Basos: 1 %
EOS (ABSOLUTE): 0.4 10*3/uL (ref 0.0–0.4)
Eos: 6 %
Hematocrit: 40.7 % (ref 34.0–46.6)
Hemoglobin: 13.7 g/dL (ref 11.1–15.9)
Immature Grans (Abs): 0 10*3/uL (ref 0.0–0.1)
Immature Granulocytes: 0 %
Lymphocytes Absolute: 2.2 10*3/uL (ref 0.7–3.1)
Lymphs: 31 %
MCH: 30 pg (ref 26.6–33.0)
MCHC: 33.7 g/dL (ref 31.5–35.7)
MCV: 89 fL (ref 79–97)
Monocytes Absolute: 0.5 10*3/uL (ref 0.1–0.9)
Monocytes: 8 %
Neutrophils Absolute: 3.8 10*3/uL (ref 1.4–7.0)
Neutrophils: 54 %
Platelets: 288 10*3/uL (ref 150–450)
RBC: 4.56 x10E6/uL (ref 3.77–5.28)
RDW: 12.3 % (ref 11.7–15.4)
WBC: 7 10*3/uL (ref 3.4–10.8)

## 2023-08-20 LAB — LIPID PANEL
Chol/HDL Ratio: 2.9 ratio (ref 0.0–4.4)
Cholesterol, Total: 130 mg/dL (ref 100–199)
HDL: 45 mg/dL (ref >39)
LDL Chol Calc (NIH): 66 mg/dL (ref 0–99)
Triglycerides: 100 mg/dL (ref 0–149)
VLDL Cholesterol Cal: 19 mg/dL (ref 5–40)

## 2023-08-20 LAB — RHEUMATOID FACTOR: Rheumatoid fact SerPl-aCnc: <10 [IU]/mL (ref <14.0)

## 2023-08-20 LAB — TSH: TSH: 1.37 u[IU]/mL (ref 0.450–4.500)

## 2023-09-04 ENCOUNTER — Other Ambulatory Visit: Payer: Self-pay

## 2023-10-15 ENCOUNTER — Other Ambulatory Visit (HOSPITAL_COMMUNITY): Payer: Self-pay

## 2023-10-29 ENCOUNTER — Encounter (HOSPITAL_COMMUNITY): Payer: Self-pay

## 2023-10-30 ENCOUNTER — Other Ambulatory Visit (HOSPITAL_COMMUNITY): Payer: Self-pay

## 2023-10-30 ENCOUNTER — Other Ambulatory Visit: Payer: Self-pay

## 2023-11-14 ENCOUNTER — Other Ambulatory Visit (HOSPITAL_COMMUNITY): Payer: Self-pay

## 2023-11-18 ENCOUNTER — Telehealth: Admitting: Physician Assistant

## 2023-11-18 ENCOUNTER — Encounter (HOSPITAL_COMMUNITY): Payer: Self-pay

## 2023-11-18 ENCOUNTER — Other Ambulatory Visit: Payer: Self-pay

## 2023-11-18 DIAGNOSIS — M5441 Lumbago with sciatica, right side: Secondary | ICD-10-CM

## 2023-11-18 MED ORDER — CYCLOBENZAPRINE HCL 10 MG PO TABS
10.0000 mg | ORAL_TABLET | Freq: Three times a day (TID) | ORAL | 0 refills | Status: DC | PRN
  Filled 2023-11-18: qty 30, 10d supply, fill #0

## 2023-11-18 MED ORDER — NAPROXEN 500 MG PO TABS
500.0000 mg | ORAL_TABLET | Freq: Two times a day (BID) | ORAL | 0 refills | Status: DC
  Filled 2023-11-18: qty 20, 10d supply, fill #0

## 2023-11-18 NOTE — Progress Notes (Signed)
 I have spent 5 minutes in review of e-visit questionnaire, review and updating patient chart, medical decision making and response to patient.   Piedad Climes, PA-C

## 2023-11-18 NOTE — Progress Notes (Signed)

## 2023-11-19 ENCOUNTER — Other Ambulatory Visit (HOSPITAL_COMMUNITY): Payer: Self-pay

## 2023-12-03 ENCOUNTER — Other Ambulatory Visit (HOSPITAL_COMMUNITY): Payer: Self-pay

## 2023-12-23 ENCOUNTER — Ambulatory Visit: Admitting: Family

## 2023-12-23 ENCOUNTER — Other Ambulatory Visit (HOSPITAL_COMMUNITY): Payer: Self-pay

## 2023-12-23 ENCOUNTER — Encounter: Payer: Self-pay | Admitting: Family

## 2023-12-23 ENCOUNTER — Other Ambulatory Visit: Payer: Self-pay

## 2023-12-23 VITALS — BP 111/74 | HR 69 | Temp 98.7°F | Ht 65.0 in | Wt 163.0 lb

## 2023-12-23 DIAGNOSIS — M5441 Lumbago with sciatica, right side: Secondary | ICD-10-CM

## 2023-12-23 MED ORDER — BACLOFEN 10 MG PO TABS
10.0000 mg | ORAL_TABLET | Freq: Three times a day (TID) | ORAL | 0 refills | Status: AC
Start: 2023-12-23 — End: ?
  Filled 2023-12-23: qty 30, 10d supply, fill #0

## 2023-12-23 MED ORDER — PREDNISONE 10 MG (21) PO TBPK
ORAL_TABLET | ORAL | 0 refills | Status: AC
  Filled 2023-12-23: qty 21, 6d supply, fill #0

## 2023-12-23 MED ORDER — DICLOFENAC SODIUM 75 MG PO TBEC
75.0000 mg | DELAYED_RELEASE_TABLET | Freq: Two times a day (BID) | ORAL | 4 refills | Status: AC
  Filled 2023-12-23 – 2024-02-01 (×2): qty 180, 90d supply, fill #0
  Filled 2024-04-28: qty 180, 90d supply, fill #1

## 2023-12-23 NOTE — Patient Instructions (Signed)

## 2023-12-23 NOTE — Progress Notes (Signed)
 Subjective:    Patient ID: Kylie Castillo, female    DOB: September 13, 1970, 53 y.o.   MRN: [id number]  Chief Complaint  Patient presents with   Back Pain   PT presents to the office today with bilateral lower back pain that radiates down right leg.  Back Pain This is a recurrent problem. The current episode started more than 1 month ago. The problem occurs intermittently. The problem has been waxing and waning since onset. The pain is present in the lumbar spine and gluteal. The quality of the pain is described as aching. The pain radiates to the right thigh. The pain is at a severity of 4/10. The pain is moderate. The symptoms are aggravated by twisting and bending. Associated symptoms include leg pain. She has tried bed rest and NSAIDs for the symptoms. The treatment provided mild relief.      Review of Systems  Musculoskeletal:  Positive for back pain.  All other systems reviewed and are negative.   Social History   Socioeconomic History   Marital status: Married    Spouse name: Not on file   Number of children: Not on file   Years of education: Not on file   Highest education level: Not on file  Occupational History   Not on file  Tobacco Use   Smoking status: Former    Current packs/day: 0.00    Types: Cigarettes    Quit date: 08/01/2016    Years since quitting: 7.3   Smokeless tobacco: Never  Vaping Use   Vaping status: Never Used  Substance and Sexual Activity   Alcohol use: Not Currently    Alcohol/week: 0.0 standard drinks of alcohol   Drug use: Never   Sexual activity: Yes    Birth control/protection: Surgical  Other Topics Concern   Not on file  Social History Narrative   Not on file   Social Drivers of Health   Financial Resource Strain: Not on file  Food Insecurity: No Food Insecurity (12/23/2023)   Hunger Vital Sign    Worried About Running Out of Food in the Last Year: Never true    Ran Out of Food in the Last Year: Never true  Transportation Needs: No  Transportation Needs (12/23/2023)   PRAPARE - Administrator, Civil Service (Medical): No    Lack of Transportation (Non-Medical): No  Physical Activity: Not on file  Stress: Not on file  Social Connections: Not on file   Family History  Problem Relation Age of Onset   Stroke Mother    Hyperlipidemia Mother    Other Mother        GIST   Heart disease Father    Diabetes Father    Hypertension Father    Hyperlipidemia Father    Alcohol abuse Brother    Stroke Brother    Ovarian cancer Maternal Aunt    Anxiety disorder Maternal Aunt    Depression Maternal Aunt    Heart disease Maternal Aunt    Colon cancer Maternal Grandmother    Lung cancer Maternal Grandfather    Heart disease Maternal Grandfather    Hyperlipidemia Maternal Grandfather    Heart attack Paternal Grandfather    Heart disease Paternal Grandfather    Hyperlipidemia Paternal Grandfather    Esophageal cancer Neg Hx    Stomach cancer Neg Hx    Rectal cancer Neg Hx         Objective:   Physical Exam Vitals reviewed.  Constitutional:  General: She is not in acute distress.    Appearance: She is well-developed.  HENT:     Head: Normocephalic and atraumatic.  Eyes:     Pupils: Pupils are equal, round, and reactive to light.  Neck:     Thyroid: No thyromegaly.  Cardiovascular:     Rate and Rhythm: Normal rate and regular rhythm.     Heart sounds: Normal heart sounds. No murmur heard. Pulmonary:     Effort: Pulmonary effort is normal. No respiratory distress.     Breath sounds: No wheezing or rhonchi.  Abdominal:     General: Bowel sounds are normal. There is no distension.     Palpations: Abdomen is soft.     Tenderness: There is no abdominal tenderness.  Musculoskeletal:        General: No tenderness. Normal range of motion.     Cervical back: Normal range of motion and neck supple.     Comments: Full ROM, pain in lumbar with flexion  Skin:    General: Skin is warm and dry.   Neurological:     Mental Status: She is alert and oriented to person, place, and time.     Cranial Nerves: No cranial nerve deficit.     Deep Tendon Reflexes: Reflexes are normal and symmetric.  Psychiatric:        Behavior: Behavior normal.        Thought Content: Thought content normal.        Judgment: Judgment normal.       BP 111/74   Pulse 69   Temp 98.7 F (37.1 C)   Ht 5' 5 (1.651 m)   Wt 163 lb (73.9 kg)   LMP 02/21/2015   SpO2 97%   BMI 27.12 kg/m      Assessment & Plan:  JOLIN BENAVIDES comes in today with chief complaint of Back Pain   Diagnosis and orders addressed:  1. Acute right-sided low back pain with right-sided sciatica (Primary) Rest Heat Continue diclofenac  BID with food, no other NSAID's ROM exercises discussed- handout given  Will change flexeril  to baclofen   Prednisone  pack  Follow up if symptoms worsen or do not improve  - baclofen  (LIORESAL ) 10 MG tablet; Take 1 tablet (10 mg total) by mouth 3 (three) times daily.  Dispense: 30 each; Refill: 0 - diclofenac  (VOLTAREN ) 75 MG EC tablet; Take 1 tablet (75 mg total) by mouth 2 (two) times daily.  Dispense: 180 tablet; Refill: 4 - predniSONE  (STERAPRED UNI-PAK 21 TAB) 10 MG (21) TBPK tablet; Take as directed per package instructions.  Dispense: 21 tablet; Refill: 0     Bari Learn, FNP

## 2024-02-02 ENCOUNTER — Other Ambulatory Visit: Payer: Self-pay

## 2024-02-02 ENCOUNTER — Other Ambulatory Visit (HOSPITAL_COMMUNITY): Payer: Self-pay

## 2024-02-26 ENCOUNTER — Other Ambulatory Visit (HOSPITAL_COMMUNITY): Payer: Self-pay

## 2024-04-28 ENCOUNTER — Other Ambulatory Visit (HOSPITAL_COMMUNITY): Payer: Self-pay

## 2024-04-28 ENCOUNTER — Other Ambulatory Visit (HOSPITAL_COMMUNITY): Payer: Self-pay | Admitting: Family

## 2024-04-28 DIAGNOSIS — Z1231 Encounter for screening mammogram for malignant neoplasm of breast: Secondary | ICD-10-CM

## 2024-05-03 ENCOUNTER — Inpatient Hospital Stay (HOSPITAL_COMMUNITY): Admission: RE | Admit: 2024-05-03 | Discharge: 2024-05-03

## 2024-05-03 DIAGNOSIS — Z1231 Encounter for screening mammogram for malignant neoplasm of breast: Secondary | ICD-10-CM

## 2024-05-31 ENCOUNTER — Other Ambulatory Visit (HOSPITAL_COMMUNITY): Payer: Self-pay

## 2024-06-25 ENCOUNTER — Encounter: Payer: Self-pay | Admitting: *Deleted

## 2024-08-20 ENCOUNTER — Encounter: Payer: Self-pay | Admitting: Family

## 2024-08-31 ENCOUNTER — Encounter: Admitting: Family
